# Patient Record
Sex: Female | Born: 1940 | Race: White | Hispanic: No | Marital: Married | Smoking: Never smoker
Health system: Southern US, Community
[De-identification: ages and names within clinical notes are randomized; demographics above are authoritative.]

## PROBLEM LIST (undated history)

## (undated) DIAGNOSIS — T7840XA Allergy, unspecified, initial encounter: Secondary | ICD-10-CM

## (undated) DIAGNOSIS — M1990 Unspecified osteoarthritis, unspecified site: Secondary | ICD-10-CM

## (undated) DIAGNOSIS — I2510 Atherosclerotic heart disease of native coronary artery without angina pectoris: Secondary | ICD-10-CM

## (undated) DIAGNOSIS — E7800 Pure hypercholesterolemia, unspecified: Secondary | ICD-10-CM

## (undated) DIAGNOSIS — I10 Essential (primary) hypertension: Secondary | ICD-10-CM

## (undated) HISTORY — DX: Essential (primary) hypertension: I10

## (undated) HISTORY — PX: TUBAL LIGATION: SHX77

## (undated) HISTORY — DX: Allergy, unspecified, initial encounter: T78.40XA

## (undated) HISTORY — PX: OTHER SURGICAL HISTORY: SHX169

## (undated) HISTORY — PX: TONSILLECTOMY: SUR1361

## (undated) HISTORY — PX: COLONOSCOPY: SHX174

---

## 1997-12-08 ENCOUNTER — Other Ambulatory Visit: Admission: RE | Admit: 1997-12-08 | Discharge: 1997-12-08 | Payer: Self-pay | Admitting: *Deleted

## 1998-05-13 ENCOUNTER — Other Ambulatory Visit: Admission: RE | Admit: 1998-05-13 | Discharge: 1998-05-13 | Payer: Self-pay | Admitting: *Deleted

## 1999-01-07 ENCOUNTER — Other Ambulatory Visit: Admission: RE | Admit: 1999-01-07 | Discharge: 1999-01-07 | Payer: Self-pay | Admitting: Obstetrics and Gynecology

## 1999-01-12 ENCOUNTER — Encounter: Payer: Self-pay | Admitting: Obstetrics and Gynecology

## 1999-01-12 ENCOUNTER — Encounter: Admission: RE | Admit: 1999-01-12 | Discharge: 1999-01-12 | Payer: Self-pay | Admitting: Obstetrics and Gynecology

## 1999-02-17 ENCOUNTER — Other Ambulatory Visit: Admission: RE | Admit: 1999-02-17 | Discharge: 1999-02-17 | Payer: Self-pay | Admitting: Obstetrics and Gynecology

## 1999-02-18 ENCOUNTER — Other Ambulatory Visit: Admission: RE | Admit: 1999-02-18 | Discharge: 1999-02-18 | Payer: Self-pay | Admitting: Obstetrics and Gynecology

## 2000-04-18 ENCOUNTER — Other Ambulatory Visit: Admission: RE | Admit: 2000-04-18 | Discharge: 2000-04-18 | Payer: Self-pay | Admitting: *Deleted

## 2000-04-20 ENCOUNTER — Encounter: Admission: RE | Admit: 2000-04-20 | Discharge: 2000-04-20 | Payer: Self-pay | Admitting: Obstetrics and Gynecology

## 2000-04-20 ENCOUNTER — Encounter: Payer: Self-pay | Admitting: *Deleted

## 2002-12-23 ENCOUNTER — Encounter: Payer: Self-pay | Admitting: Internal Medicine

## 2002-12-23 ENCOUNTER — Encounter: Admission: RE | Admit: 2002-12-23 | Discharge: 2002-12-23 | Payer: Self-pay | Admitting: Internal Medicine

## 2004-10-08 ENCOUNTER — Ambulatory Visit: Payer: Self-pay | Admitting: Physical Medicine & Rehabilitation

## 2004-10-08 ENCOUNTER — Inpatient Hospital Stay (HOSPITAL_COMMUNITY): Admission: AC | Admit: 2004-10-08 | Discharge: 2004-10-13 | Payer: Self-pay

## 2004-10-13 ENCOUNTER — Inpatient Hospital Stay (HOSPITAL_COMMUNITY)
Admission: RE | Admit: 2004-10-13 | Discharge: 2004-10-27 | Payer: Self-pay | Admitting: Physical Medicine & Rehabilitation

## 2004-10-13 ENCOUNTER — Ambulatory Visit: Payer: Self-pay | Admitting: Physical Medicine & Rehabilitation

## 2004-10-31 ENCOUNTER — Encounter
Admission: RE | Admit: 2004-10-31 | Discharge: 2004-11-29 | Payer: Self-pay | Admitting: Physical Medicine & Rehabilitation

## 2004-11-10 ENCOUNTER — Ambulatory Visit: Payer: Self-pay | Admitting: Psychology

## 2004-11-25 ENCOUNTER — Ambulatory Visit: Payer: Self-pay | Admitting: Physical Medicine & Rehabilitation

## 2004-11-25 ENCOUNTER — Encounter
Admission: RE | Admit: 2004-11-25 | Discharge: 2005-02-23 | Payer: Self-pay | Admitting: Physical Medicine & Rehabilitation

## 2004-11-30 ENCOUNTER — Encounter
Admission: RE | Admit: 2004-11-30 | Discharge: 2004-12-28 | Payer: Self-pay | Admitting: Physical Medicine & Rehabilitation

## 2004-12-29 ENCOUNTER — Encounter
Admission: RE | Admit: 2004-12-29 | Discharge: 2005-01-23 | Payer: Self-pay | Admitting: Physical Medicine & Rehabilitation

## 2005-01-24 ENCOUNTER — Encounter
Admission: RE | Admit: 2005-01-24 | Discharge: 2005-02-21 | Payer: Self-pay | Admitting: Physical Medicine & Rehabilitation

## 2005-02-17 ENCOUNTER — Ambulatory Visit: Payer: Self-pay | Admitting: Physical Medicine & Rehabilitation

## 2005-02-22 ENCOUNTER — Encounter
Admission: RE | Admit: 2005-02-22 | Discharge: 2005-03-28 | Payer: Self-pay | Admitting: Physical Medicine & Rehabilitation

## 2005-07-17 ENCOUNTER — Ambulatory Visit: Payer: Self-pay | Admitting: Physical Medicine & Rehabilitation

## 2005-07-17 ENCOUNTER — Encounter
Admission: RE | Admit: 2005-07-17 | Discharge: 2005-10-15 | Payer: Self-pay | Admitting: Physical Medicine & Rehabilitation

## 2012-07-30 ENCOUNTER — Ambulatory Visit (AMBULATORY_SURGERY_CENTER): Payer: Medicare Other

## 2012-07-30 VITALS — Ht 65.0 in | Wt 170.8 lb

## 2012-07-30 DIAGNOSIS — Z1211 Encounter for screening for malignant neoplasm of colon: Secondary | ICD-10-CM

## 2012-07-30 DIAGNOSIS — Z800 Family history of malignant neoplasm of digestive organs: Secondary | ICD-10-CM

## 2012-07-30 MED ORDER — MOVIPREP 100 G PO SOLR: ORAL | Status: DC

## 2012-07-30 NOTE — Progress Notes (Signed)
Pt came into the office today for her pre-visit prior to her colonoscopy with Dr  Marina Goodell on 08/09/12. Pt states she had a colonoscopy done 10 years ago with Dr Kinnie Scales. A medical release form was filled out to be given to [place] ([facility]MA).

## 2012-07-31 ENCOUNTER — Encounter: Payer: Self-pay | Admitting: Internal Medicine

## 2012-08-09 ENCOUNTER — Ambulatory Visit (AMBULATORY_SURGERY_CENTER): Payer: Medicare Other | Admitting: Internal Medicine

## 2012-08-09 ENCOUNTER — Encounter: Payer: Self-pay | Admitting: Internal Medicine

## 2012-08-09 VITALS — BP 147/89 | HR 63 | Temp 99.6°F | Resp 33 | Ht 65.0 in | Wt 170.0 lb

## 2012-08-09 DIAGNOSIS — Z800 Family history of malignant neoplasm of digestive organs: Secondary | ICD-10-CM

## 2012-08-09 DIAGNOSIS — D126 Benign neoplasm of colon, unspecified: Secondary | ICD-10-CM

## 2012-08-09 DIAGNOSIS — Z1211 Encounter for screening for malignant neoplasm of colon: Secondary | ICD-10-CM

## 2012-08-09 MED ORDER — SODIUM CHLORIDE 0.9 % IV SOLN: 500.0000 mL | INTRAVENOUS | Status: DC

## 2012-08-09 NOTE — Progress Notes (Signed)
Called to room to assist during endoscopic procedure.  Patient ID and intended procedure confirmed with present staff. Received instructions for my participation in the procedure from the performing physician.  

## 2012-08-09 NOTE — Progress Notes (Signed)
Report to pacu rn, vss, bbs=clear 

## 2012-08-09 NOTE — Op Note (Signed)
[facility] [address] [place], [address]   COLONOSCOPY PROCEDURE REPORT  PATIENT: Thompson, Debra  MR#: [id number] BIRTHDATE: 08-18-1940 , 72  yrs. old GENDER: Female ENDOSCOPIST: Roxy Cedar, MD REFERRED UU:VOZDGUY Tisovec, M.D. PROCEDURE DATE:  08/09/2012 PROCEDURE:   Colonoscopy with snare polypectomy    x 1 ASA CLASS:   Class II INDICATIONS:Average risk patient for colon cancer.   Reports negative screening exam with Dr. Kinnie Scales 10 yrs ago MEDICATIONS: MAC sedation, administered by CRNA and propofol (Diprivan) 250mg  IV  DESCRIPTION OF PROCEDURE:   After the risks benefits and alternatives of the procedure were thoroughly explained, informed consent was obtained.  A digital rectal exam revealed no abnormalities of the rectum.   The LB [id number]  endoscope was introduced through the anus and advanced to the cecum, which was identified by both the appendix and ileocecal valve. No adverse events experienced.   The quality of the prep was good, using MoviPrep  The instrument was then slowly withdrawn as the colon was fully examined.      COLON FINDINGS: A diminutive polyp was found in the transverse colon.  A polypectomy was performed with a cold snare.  The resection was complete and the polyp tissue was completely retrieved.   Moderate diverticulosis was noted The finding was in the left colon.   Moderate melanosis was found throughout the entire examined colon.   The colon mucosa was otherwise normal. Retroflexed views revealed internal hemorrhoids. The time to cecum=4 minutes 41 seconds.  Withdrawal time=11 minutes 27 seconds. The scope was withdrawn and the procedure completed. COMPLICATIONS: There were no complications.  ENDOSCOPIC IMPRESSION: 1.   Diminutive polyp was found in the transverse colon; polypectomy was performed with a cold snare 2.   Moderate diverticulosis was noted in the left colon 3.   The colon mucosa was  otherwise normal  RECOMMENDATIONS: 1. Return to the care of your primary provider.  GI follow up as needed   eSigned:  Roxy Cedar, MD 08/09/2012 12:35 PM  cc: Guerry Bruin, MD and The Patient   PATIENT NAME:  Debra Thompson, Debra Thompson MR#: [id number]

## 2012-08-09 NOTE — Progress Notes (Signed)
No egg or soy allergy. emw 

## 2012-08-09 NOTE — Progress Notes (Signed)
Patient did not have preoperative order for IV antibiotic SSI prophylaxis. (G8918)  Patient did not experience any of the following events: a burn prior to discharge; a fall within the facility; wrong site/side/patient/procedure/implant event; or a hospital transfer or hospital admission upon discharge from the facility. (G8907)  

## 2012-08-09 NOTE — Patient Instructions (Addendum)
YOU HAD AN ENDOSCOPIC PROCEDURE TODAY AT THE [facility]: Refer to the procedure report that was given to you for any specific questions about what was found during the examination.  If the procedure report does not answer your questions, please call your gastroenterologist to clarify.  If you requested that your care partner not be given the details of your procedure findings, then the procedure report has been included in a sealed envelope for you to review at your convenience later.  YOU SHOULD EXPECT: Some feelings of bloating in the abdomen. Passage of more gas than usual.  Walking can help get rid of the air that was put into your GI tract during the procedure and reduce the bloating. If you had a lower endoscopy (such as a colonoscopy or flexible sigmoidoscopy) you may notice spotting of blood in your stool or on the toilet paper. If you underwent a bowel prep for your procedure, then you may not have a normal bowel movement for a few days.  DIET: Your first meal following the procedure should be a light meal and then it is ok to progress to your normal diet.  A half-sandwich or bowl of soup is an example of a good first meal.  Heavy or fried foods are harder to digest and may make you feel nauseous or bloated.  Likewise meals heavy in dairy and vegetables can cause extra gas to form and this can also increase the bloating.  Drink plenty of fluids but you should avoid alcoholic beverages for 24 hours.  ACTIVITY: Your care partner should take you home directly after the procedure.  You should plan to take it easy, moving slowly for the rest of the day.  You can resume normal activity the day after the procedure however you should NOT DRIVE or use heavy machinery for 24 hours (because of the sedation medicines used during the test).    SYMPTOMS TO REPORT IMMEDIATELY: A gastroenterologist can be reached at any hour.  During normal business hours, 8:30 AM to 5:00 PM Monday through Friday,  call [phone].  After hours and on weekends, please call the GI answering service at [phone] who will take a message and have the physician on call contact you.   Following lower endoscopy (colonoscopy or flexible sigmoidoscopy):  Excessive amounts of blood in the stool  Significant tenderness or worsening of abdominal pains  Swelling of the abdomen that is new, acute  Fever of 100F or higher  Following upper endoscopy (EGD)  Vomiting of blood or coffee ground material  New chest pain or pain under the shoulder blades  Painful or persistently difficult swallowing  New shortness of breath  Fever of 100F or higher  Black, tarry-looking stools  FOLLOW UP: If any biopsies were taken you will be contacted by phone or by letter within the next 1-3 weeks.  Call your gastroenterologist if you have not heard about the biopsies in 3 weeks.  Our staff will call the home number listed on your records the next business day following your procedure to check on you and address any questions or concerns that you may have at that time regarding the information given to you following your procedure. This is a courtesy call and so if there is no answer at the home number and we have not heard from you through the emergency physician on call, we will assume that you have returned to your regular daily activities without incident.  SIGNATURES/CONFIDENTIALITY: You and/or your care   partner have signed paperwork which will be entered into your electronic medical record.  These signatures attest to the fact that that the information above on your After Visit Summary has been reviewed and is understood.  Full responsibility of the confidentiality of this discharge information lies with you and/or your care-partner.    INFORMATION ON POLYPS,DIVERTICULOSIS,& HIGH FIBER DIET GIVEN TO YOU TODAY  

## 2012-08-12 ENCOUNTER — Telehealth: Payer: Self-pay | Admitting: *Deleted

## 2012-08-12 NOTE — Telephone Encounter (Signed)
Left message on #[phone] that pts husband had requested we call for f/u . No answer received @ home #.

## 2012-08-13 ENCOUNTER — Encounter: Payer: Self-pay | Admitting: Internal Medicine

## 2014-01-02 ENCOUNTER — Other Ambulatory Visit: Payer: Self-pay | Admitting: Internal Medicine

## 2014-01-02 DIAGNOSIS — Z1231 Encounter for screening mammogram for malignant neoplasm of breast: Secondary | ICD-10-CM

## 2014-01-23 ENCOUNTER — Ambulatory Visit: Payer: Medicare Other

## 2014-04-30 ENCOUNTER — Encounter (HOSPITAL_COMMUNITY): Payer: Self-pay | Admitting: *Deleted

## 2014-04-30 ENCOUNTER — Inpatient Hospital Stay (HOSPITAL_COMMUNITY)
Admission: EM | Admit: 2014-04-30 | Discharge: 2014-05-03 | DRG: 282 | Disposition: A | Discharge status: Home / Self Care | Payer: Medicare Other | Attending: Cardiology | Admitting: Cardiology

## 2014-04-30 ENCOUNTER — Emergency Department (HOSPITAL_COMMUNITY): Payer: Medicare Other

## 2014-04-30 DIAGNOSIS — I10 Essential (primary) hypertension: Secondary | ICD-10-CM | POA: Diagnosis present

## 2014-04-30 DIAGNOSIS — E7800 Pure hypercholesterolemia, unspecified: Secondary | ICD-10-CM | POA: Diagnosis present

## 2014-04-30 DIAGNOSIS — M1990 Unspecified osteoarthritis, unspecified site: Secondary | ICD-10-CM | POA: Diagnosis present

## 2014-04-30 DIAGNOSIS — R079 Chest pain, unspecified: Secondary | ICD-10-CM

## 2014-04-30 DIAGNOSIS — Z8249 Family history of ischemic heart disease and other diseases of the circulatory system: Secondary | ICD-10-CM | POA: Diagnosis not present

## 2014-04-30 DIAGNOSIS — Z79899 Other long term (current) drug therapy: Secondary | ICD-10-CM | POA: Diagnosis not present

## 2014-04-30 DIAGNOSIS — I25119 Atherosclerotic heart disease of native coronary artery with unspecified angina pectoris: Secondary | ICD-10-CM | POA: Diagnosis present

## 2014-04-30 DIAGNOSIS — R7989 Other specified abnormal findings of blood chemistry: Secondary | ICD-10-CM

## 2014-04-30 DIAGNOSIS — I119 Hypertensive heart disease without heart failure: Secondary | ICD-10-CM | POA: Diagnosis present

## 2014-04-30 DIAGNOSIS — I214 Non-ST elevation (NSTEMI) myocardial infarction: Secondary | ICD-10-CM | POA: Diagnosis present

## 2014-04-30 DIAGNOSIS — Z823 Family history of stroke: Secondary | ICD-10-CM

## 2014-04-30 DIAGNOSIS — Z91041 Radiographic dye allergy status: Secondary | ICD-10-CM

## 2014-04-30 DIAGNOSIS — Z87891 Personal history of nicotine dependence: Secondary | ICD-10-CM | POA: Diagnosis not present

## 2014-04-30 DIAGNOSIS — E785 Hyperlipidemia, unspecified: Secondary | ICD-10-CM | POA: Diagnosis present

## 2014-04-30 HISTORY — DX: Atherosclerotic heart disease of native coronary artery without angina pectoris: I25.10

## 2014-04-30 HISTORY — DX: Pure hypercholesterolemia, unspecified: E78.00

## 2014-04-30 HISTORY — DX: Unspecified osteoarthritis, unspecified site: M19.90

## 2014-04-30 LAB — BASIC METABOLIC PANEL
ANION GAP: 7 (ref 5–15)
BUN: 11 mg/dL (ref 6–23)
CALCIUM: 8.9 mg/dL (ref 8.4–10.5)
CO2: 25 mmol/L (ref 19–32)
CREATININE: 0.58 mg/dL (ref 0.50–1.10)
Chloride: 109 mmol/L (ref 96–112)
GFR calc non Af Amer: 89 mL/min — ABNORMAL LOW (ref >90)
Glucose, Bld: 131 mg/dL — ABNORMAL HIGH (ref 70–99)
Potassium: 4.1 mmol/L (ref 3.5–5.1)
Sodium: 141 mmol/L (ref 135–145)

## 2014-04-30 LAB — CBC
HEMATOCRIT: 38.8 % (ref 36.0–46.0)
HEMOGLOBIN: 13.1 g/dL (ref 12.0–15.0)
MCH: 30.5 pg (ref 26.0–34.0)
MCHC: 33.8 g/dL (ref 30.0–36.0)
MCV: 90.4 fL (ref 78.0–100.0)
Platelets: 198 10*3/uL (ref 150–400)
RBC: 4.29 MIL/uL (ref 3.87–5.11)
RDW: 13 % (ref 11.5–15.5)
WBC: 8 10*3/uL (ref 4.0–10.5)

## 2014-04-30 LAB — I-STAT TROPONIN, ED: Troponin i, poc: 0.98 ng/mL (ref 0.00–0.08)

## 2014-04-30 MED ORDER — HEPARIN BOLUS VIA INFUSION
4000.0000 [IU] | Freq: Once | INTRAVENOUS | Status: AC
Start: 2014-04-30 — End: 2014-04-30
  Administered 2014-04-30: 4000 [IU] via INTRAVENOUS
  Filled 2014-04-30: qty 4000

## 2014-04-30 MED ORDER — MORPHINE SULFATE 2 MG/ML IJ SOLN
2.0000 mg | Freq: Once | INTRAMUSCULAR | Status: AC
  Administered 2014-04-30: 2 mg via INTRAVENOUS
  Filled 2014-04-30: qty 1

## 2014-04-30 MED ORDER — ACETAMINOPHEN 325 MG PO TABS
650.0000 mg | ORAL_TABLET | ORAL | Status: DC | PRN
Start: 2014-04-30 — End: 2014-05-03
  Administered 2014-05-01: 650 mg via ORAL
  Filled 2014-04-30: qty 2

## 2014-04-30 MED ORDER — NITROGLYCERIN 0.4 MG SL SUBL
0.4000 mg | SUBLINGUAL_TABLET | SUBLINGUAL | Status: DC | PRN
  Administered 2014-05-01 (×2): 0.4 mg via SUBLINGUAL
  Filled 2014-04-30 (×2): qty 1

## 2014-04-30 MED ORDER — SODIUM CHLORIDE 0.9 % IJ SOLN: 3.0000 mL | Freq: Two times a day (BID) | INTRAMUSCULAR | Status: DC

## 2014-04-30 MED ORDER — HEPARIN (PORCINE) IN NACL 100-0.45 UNIT/ML-% IJ SOLN
850.0000 [IU]/h | INTRAMUSCULAR | Status: DC
  Administered 2014-04-30: 850 [IU]/h via INTRAVENOUS
  Filled 2014-04-30: qty 250

## 2014-04-30 MED ORDER — ONDANSETRON HCL 4 MG/2ML IJ SOLN: 4.0000 mg | Freq: Four times a day (QID) | INTRAMUSCULAR | Status: DC | PRN

## 2014-04-30 MED ORDER — DIPHENHYDRAMINE HCL 50 MG/ML IJ SOLN
25.0000 mg | INTRAMUSCULAR | Status: AC
  Administered 2014-05-01: 25 mg via INTRAVENOUS
  Filled 2014-04-30: qty 1

## 2014-04-30 MED ORDER — HEPARIN (PORCINE) IN NACL 100-0.45 UNIT/ML-% IJ SOLN: 12.0000 [IU]/kg/h | INTRAMUSCULAR | Status: DC

## 2014-04-30 MED ORDER — ATORVASTATIN CALCIUM 10 MG PO TABS
10.0000 mg | ORAL_TABLET | Freq: Every day | ORAL | Status: DC
  Administered 2014-05-01 – 2014-05-03 (×3): 10 mg via ORAL
  Filled 2014-04-30 (×3): qty 1

## 2014-04-30 MED ORDER — PREDNISONE 20 MG PO TABS
60.0000 mg | ORAL_TABLET | ORAL | Status: AC
  Administered 2014-05-01: 60 mg via ORAL
  Filled 2014-04-30: qty 3

## 2014-04-30 MED ORDER — ASPIRIN 300 MG RE SUPP: 300.0000 mg | RECTAL | Status: AC

## 2014-04-30 MED ORDER — SODIUM CHLORIDE 0.9 % IV SOLN
INTRAVENOUS | Status: DC
  Administered 2014-05-01: 04:00:00 via INTRAVENOUS

## 2014-04-30 MED ORDER — ASPIRIN 81 MG PO CHEW: 324.0000 mg | CHEWABLE_TABLET | ORAL | Status: AC

## 2014-04-30 MED ORDER — SODIUM CHLORIDE 0.9 % IJ SOLN: 3.0000 mL | INTRAMUSCULAR | Status: DC | PRN

## 2014-04-30 MED ORDER — METOPROLOL TARTRATE 25 MG PO TABS
25.0000 mg | ORAL_TABLET | Freq: Two times a day (BID) | ORAL | Status: DC
  Administered 2014-05-01 – 2014-05-03 (×6): 25 mg via ORAL
  Filled 2014-04-30 (×6): qty 1

## 2014-04-30 MED ORDER — ASPIRIN 81 MG PO CHEW: 81.0000 mg | CHEWABLE_TABLET | ORAL | Status: AC

## 2014-04-30 MED ORDER — ASPIRIN EC 81 MG PO TBEC
81.0000 mg | DELAYED_RELEASE_TABLET | Freq: Every day | ORAL | Status: DC
  Administered 2014-05-01 – 2014-05-03 (×3): 81 mg via ORAL
  Filled 2014-04-30 (×3): qty 1

## 2014-04-30 MED ORDER — FAMOTIDINE IN NACL 20-0.9 MG/50ML-% IV SOLN
20.0000 mg | INTRAVENOUS | Status: AC
  Administered 2014-05-01: 20 mg via INTRAVENOUS
  Filled 2014-04-30: qty 50

## 2014-04-30 MED ORDER — RAMIPRIL 5 MG PO CAPS
5.0000 mg | ORAL_CAPSULE | Freq: Every day | ORAL | Status: DC
  Administered 2014-05-01 – 2014-05-03 (×3): 5 mg via ORAL
  Filled 2014-04-30 (×3): qty 1

## 2014-04-30 MED ORDER — SODIUM CHLORIDE 0.9 % IV SOLN: 250.0000 mL | INTRAVENOUS | Status: DC | PRN

## 2014-04-30 NOTE — Progress Notes (Signed)
ANTICOAGULATION CONSULT NOTE - Initial Consult  Pharmacy Consult for Heparin Indication: chest pain/ACS  Allergies  Allergen Reactions  . Iodine Rash    rash    Patient Measurements: Height: 5\' 5"  (165.1 cm) Weight: 176 lb (79.833 kg) IBW/kg (Calculated) : 57 Heparin Dosing Weight: 74 kg  Vital Signs: Temp: 98.4 F (36.9 C) (02/25 2003) Temp Source: Oral (02/25 2003) BP: 178/69 mmHg (02/25 2003) Pulse Rate: 68 (02/25 2003)  Labs:  Recent Labs  04/30/14 2015  HGB 13.1  HCT 38.8  PLT 198    CrCl cannot be calculated (Patient has no serum creatinine result on file.).   Medical History: Past Medical History  Diagnosis Date  . Hypertension   . Allergy   . Hypercholesteremia   . Arthritis     Medications:   (Not in a hospital admission) Scheduled:   Infusions:  . heparin    . heparin      Assessment: 74yo female with history of HLD and HTN presents with chest pain. Pharmacy is consulted to dose heparin for ACS/chest pain. CBC wnl and Trop 0.98.  Goal of Therapy:  Heparin level 0.3-0.7 units/ml Monitor platelets by anticoagulation protocol: Yes   Plan:  Give 4000 units bolus x 1 Start heparin infusion at 850 units/hr Check anti-Xa level in 8 hours and daily while on heparin Continue to monitor H&H and platelets  Monitor s/sx of bleeding  Andrey Cota. Diona Foley, PharmD Clinical Pharmacist Pager [phone] 04/30/2014,8:55 PM

## 2014-04-30 NOTE — Progress Notes (Signed)
i-stat Trop. result shown to Dr. Sabra Heck
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
## 2014-04-30 NOTE — ED Notes (Signed)
Pt arrives via EMS from home. Pt was working in the yard and lifting heavy objects when she began having chest pain at 1730. Pt then went to Urgent Care and received 324 ASA and 1 NTG. Upon EMS arrival pt was experiencing 8/10 chest pain and EMS gave another dose of NTG which then took her pain away. Pt has hx HTN and has not taken her BP meds in a few days.

## 2014-05-01 ENCOUNTER — Encounter (HOSPITAL_COMMUNITY): Payer: Self-pay | Admitting: Interventional Cardiology

## 2014-05-01 ENCOUNTER — Encounter (HOSPITAL_COMMUNITY)
Admission: EM | Disposition: A | Discharge status: Home / Self Care | Payer: Self-pay | Source: Home / Self Care | Attending: Cardiology

## 2014-05-01 DIAGNOSIS — E7800 Pure hypercholesterolemia, unspecified: Secondary | ICD-10-CM | POA: Diagnosis present

## 2014-05-01 DIAGNOSIS — I213 ST elevation (STEMI) myocardial infarction of unspecified site: Secondary | ICD-10-CM

## 2014-05-01 DIAGNOSIS — I214 Non-ST elevation (NSTEMI) myocardial infarction: Principal | ICD-10-CM

## 2014-05-01 DIAGNOSIS — I10 Essential (primary) hypertension: Secondary | ICD-10-CM | POA: Diagnosis present

## 2014-05-01 DIAGNOSIS — I2510 Atherosclerotic heart disease of native coronary artery without angina pectoris: Secondary | ICD-10-CM

## 2014-05-01 DIAGNOSIS — I25110 Atherosclerotic heart disease of native coronary artery with unstable angina pectoris: Secondary | ICD-10-CM

## 2014-05-01 HISTORY — DX: Atherosclerotic heart disease of native coronary artery without angina pectoris: I25.10

## 2014-05-01 HISTORY — PX: LEFT HEART CATHETERIZATION WITH CORONARY ANGIOGRAM: SHX5451

## 2014-05-01 LAB — COMPREHENSIVE METABOLIC PANEL
ALK PHOS: 53 U/L (ref 39–117)
ALT: 26 U/L (ref 0–35)
AST: 38 U/L — ABNORMAL HIGH (ref 0–37)
Albumin: 3.6 g/dL (ref 3.5–5.2)
Anion gap: 7 (ref 5–15)
BILIRUBIN TOTAL: 0.5 mg/dL (ref 0.3–1.2)
BUN: 8 mg/dL (ref 6–23)
CHLORIDE: 106 mmol/L (ref 96–112)
CO2: 27 mmol/L (ref 19–32)
Calcium: 8.6 mg/dL (ref 8.4–10.5)
Creatinine, Ser: 0.58 mg/dL (ref 0.50–1.10)
GFR, EST NON AFRICAN AMERICAN: 89 mL/min — AB (ref >90)
GLUCOSE: 127 mg/dL — AB (ref 70–99)
POTASSIUM: 3.7 mmol/L (ref 3.5–5.1)
Sodium: 140 mmol/L (ref 135–145)
Total Protein: 6.7 g/dL (ref 6.0–8.3)

## 2014-05-01 LAB — CBC WITH DIFFERENTIAL/PLATELET
BASOS PCT: 0 % (ref 0–1)
Basophils Absolute: 0 10*3/uL (ref 0.0–0.1)
Eosinophils Absolute: 0.1 10*3/uL (ref 0.0–0.7)
Eosinophils Relative: 1 % (ref 0–5)
HCT: 37.6 % (ref 36.0–46.0)
HEMOGLOBIN: 12.8 g/dL (ref 12.0–15.0)
LYMPHS ABS: 2.4 10*3/uL (ref 0.7–4.0)
Lymphocytes Relative: 28 % (ref 12–46)
MCH: 30.1 pg (ref 26.0–34.0)
MCHC: 34 g/dL (ref 30.0–36.0)
MCV: 88.5 fL (ref 78.0–100.0)
Monocytes Absolute: 0.5 10*3/uL (ref 0.1–1.0)
Monocytes Relative: 6 % (ref 3–12)
NEUTROS ABS: 5.6 10*3/uL (ref 1.7–7.7)
NEUTROS PCT: 65 % (ref 43–77)
PLATELETS: 224 10*3/uL (ref 150–400)
RBC: 4.25 MIL/uL (ref 3.87–5.11)
RDW: 13.1 % (ref 11.5–15.5)
WBC: 8.6 10*3/uL (ref 4.0–10.5)

## 2014-05-01 LAB — TROPONIN I
TROPONIN I: 2.57 ng/mL — AB (ref <0.031)
Troponin I: 4.1 ng/mL (ref <0.031)
Troponin I: 3.16 ng/mL (ref <0.031)

## 2014-05-01 LAB — LIPID PANEL
Cholesterol: 145 mg/dL (ref 0–200)
HDL: 58 mg/dL (ref >39)
LDL Cholesterol: 62 mg/dL (ref 0–99)
Total CHOL/HDL Ratio: 2.5 RATIO
Triglycerides: 124 mg/dL (ref <150)
VLDL: 25 mg/dL (ref 0–40)

## 2014-05-01 LAB — HEPARIN LEVEL (UNFRACTIONATED)
Heparin Unfractionated: 0.42 IU/mL (ref 0.30–0.70)
Heparin Unfractionated: 0.53 IU/mL (ref 0.30–0.70)

## 2014-05-01 LAB — PLATELET INHIBITION P2Y12: Platelet Function  P2Y12: 273 [PRU] (ref 194–418)

## 2014-05-01 LAB — PROTIME-INR
INR: 1.02 (ref 0.00–1.49)
Prothrombin Time: 13.5 seconds (ref 11.6–15.2)

## 2014-05-01 LAB — TSH: TSH: 1.723 u[IU]/mL (ref 0.350–4.500)

## 2014-05-01 LAB — MAGNESIUM: Magnesium: 2.3 mg/dL (ref 1.5–2.5)

## 2014-05-01 LAB — MRSA PCR SCREENING: MRSA by PCR: NEGATIVE

## 2014-05-01 LAB — APTT: aPTT: 70 seconds — ABNORMAL HIGH (ref 24–37)

## 2014-05-01 SURGERY — LEFT HEART CATHETERIZATION WITH CORONARY ANGIOGRAM
Anesthesia: LOCAL

## 2014-05-01 MED ORDER — SODIUM CHLORIDE 0.9 % IV SOLN: INTRAVENOUS | Status: AC

## 2014-05-01 MED ORDER — NITROGLYCERIN 1 MG/10 ML FOR IR/CATH LAB
INTRA_ARTERIAL | Status: AC
  Filled 2014-05-01: qty 10

## 2014-05-01 MED ORDER — MIDAZOLAM HCL 2 MG/2ML IJ SOLN
INTRAMUSCULAR | Status: AC
  Filled 2014-05-01: qty 2

## 2014-05-01 MED ORDER — HEPARIN (PORCINE) IN NACL 2-0.9 UNIT/ML-% IJ SOLN
INTRAMUSCULAR | Status: AC
  Filled 2014-05-01: qty 1000

## 2014-05-01 MED ORDER — HEPARIN SODIUM (PORCINE) 1000 UNIT/ML IJ SOLN
2000.0000 [IU] | Freq: Once | INTRAMUSCULAR | Status: DC
  Filled 2014-05-01: qty 2

## 2014-05-01 MED ORDER — HEPARIN SODIUM (PORCINE) 1000 UNIT/ML IJ SOLN
INTRAMUSCULAR | Status: AC
  Filled 2014-05-01: qty 1

## 2014-05-01 MED ORDER — CLOPIDOGREL BISULFATE 75 MG PO TABS
75.0000 mg | ORAL_TABLET | Freq: Every day | ORAL | Status: DC
  Administered 2014-05-02 – 2014-05-03 (×2): 75 mg via ORAL
  Filled 2014-05-01 (×2): qty 1

## 2014-05-01 MED ORDER — FENTANYL CITRATE 0.05 MG/ML IJ SOLN
INTRAMUSCULAR | Status: AC
  Filled 2014-05-01: qty 2

## 2014-05-01 MED ORDER — LIDOCAINE HCL (PF) 1 % IJ SOLN
INTRAMUSCULAR | Status: AC
  Filled 2014-05-01: qty 30

## 2014-05-01 MED ORDER — HYDROCODONE-ACETAMINOPHEN 5-325 MG PO TABS
1.0000 | ORAL_TABLET | Freq: Once | ORAL | Status: AC
  Administered 2014-05-01: 1 via ORAL
  Filled 2014-05-01: qty 1

## 2014-05-01 MED ORDER — NITROGLYCERIN IN D5W 200-5 MCG/ML-% IV SOLN: 0.0000 ug/min | INTRAVENOUS | Status: DC

## 2014-05-01 MED ORDER — VERAPAMIL HCL 2.5 MG/ML IV SOLN
INTRAVENOUS | Status: AC
  Filled 2014-05-01: qty 2

## 2014-05-01 MED ORDER — HEPARIN (PORCINE) IN NACL 100-0.45 UNIT/ML-% IJ SOLN
850.0000 [IU]/h | INTRAMUSCULAR | Status: DC
  Administered 2014-05-02: 850 [IU]/h via INTRAVENOUS
  Filled 2014-05-01: qty 250

## 2014-05-01 NOTE — Progress Notes (Signed)
ANTICOAGULATION CONSULT NOTE - Follow Up Consult  Pharmacy Consult for Heparin Indication: chest pain/ACS  Allergies  Allergen Reactions  . Iodine Rash    rash    Patient Measurements: Height: 5\' 5"  (165.1 cm) Weight: 170 lb 4.8 oz (77.248 kg) IBW/kg (Calculated) : 57 Heparin Dosing Weight: 73 kg  Vital Signs: Temp: 98.4 F (36.9 C) (02/26 1537) Temp Source: Oral (02/26 1537) BP: 141/61 mmHg (02/26 1922) Pulse Rate: 63 (02/26 1920)  Labs:  Recent Labs  04/30/14 2015 05/01/14 0103 05/01/14 0540 05/01/14 0753 05/01/14 1203  HGB 13.1 12.8  --   --   --   HCT 38.8 37.6  --   --   --   PLT 198 224  --   --   --   APTT  --  70*  --   --   --   LABPROT  --  13.5  --   --   --   INR  --  1.02  --   --   --   HEPARINUNFRC  --  0.42  --  0.53  --   CREATININE 0.58 0.58  --   --   --   TROPONINI  --  4.10* 2.57*  --  3.16*    Estimated Creatinine Clearance: 63.4 mL/min (by C-G formula based on Cr of 0.58).   Medications:  Scheduled:  . aspirin  324 mg Oral NOW   Or  . aspirin  300 mg Rectal NOW  . aspirin EC  81 mg Oral Daily  . atorvastatin  10 mg Oral Daily  . [START ON 05/02/2014] clopidogrel  75 mg Oral Q breakfast  . heparin  2,000 Units Intravenous Once  . metoprolol tartrate  25 mg Oral BID  . ramipril  5 mg Oral Daily   Infusions:  . sodium chloride 50 mL/hr at 05/01/14 0405  . sodium chloride    . heparin 850 Units/hr (04/30/14 2134)  . nitroGLYCERIN      Assessment: 74 yo F with NSTEMI. Pt got cath this PM. CAD. Recommending medrx for now. If symptoms recur, will need PCI. Heparin reordered 8hr post sheath removal.   Goal of Therapy:  Heparin level 0.3-0.7 units/ml Monitor platelets by anticoagulation protocol: Yes   Plan:   Resuming heparin at 850 units/hr Daily heparin level and CBC while on heparin

## 2014-05-01 NOTE — Progress Notes (Signed)
Echocardiogram 2D Echocardiogram has been performed.  Joelene Millin 05/01/2014, 10:56 AM

## 2014-05-01 NOTE — Progress Notes (Signed)
Pt C/O chest discomfort. 1 Nitro given. Pt states she feels relief. Will continue to monitor. Etta Quill, RN

## 2014-05-01 NOTE — H&P (View-Only) (Signed)
Patient Name: Debra Thompson Date of Encounter: 05/01/2014  Primary Cardiologist: New- Dr. Radford Pax   Principal Problem:   NSTEMI (non-ST elevated myocardial infarction) Active Problems:   Hypertension   Hypercholesteremia    SUBJECTIVE  Chest discomfort significantly improved. No SOB  CURRENT MEDS . aspirin  324 mg Oral NOW   Or  . aspirin  300 mg Rectal NOW  . aspirin EC  81 mg Oral Daily  . atorvastatin  10 mg Oral Daily  . diphenhydrAMINE  25 mg Intravenous Pre-Cath  . famotidine (PEPCID) IV  20 mg Intravenous Pre-Cath  . metoprolol tartrate  25 mg Oral BID  . predniSONE  60 mg Oral Pre-Cath  . ramipril  5 mg Oral Daily  . sodium chloride  3 mL Intravenous Q12H    OBJECTIVE  Filed Vitals:   04/30/14 2300 04/30/14 2345 05/01/14 0413 05/01/14 0801  BP: 187/84 175/81 156/79 144/59  Pulse: 68   67  Temp:   98.5 F (36.9 C) 98.2 F (36.8 C)  TempSrc:   Oral Oral  Resp: 17 17 18 15   Height:      Weight:    170 lb 4.8 oz (77.248 kg)  SpO2: 97%  97% 95%    Intake/Output Summary (Last 24 hours) at 05/01/14 0958 Last data filed at 05/01/14 0800  Gross per 24 hour  Intake   58.3 ml  Output   1550 ml  Net -1491.7 ml   Filed Weights   04/30/14 2003 05/01/14 0801  Weight: 176 lb (79.833 kg) 170 lb 4.8 oz (77.248 kg)    PHYSICAL EXAM  General: Pleasant, NAD. Neuro: Alert and oriented X 3. Moves all extremities spontaneously. Psych: Normal affect. HEENT:  Normal  Neck: Supple without bruits or JVD. Lungs:  Resp regular and unlabored, CTA. Heart: RRR no s3, s4, or murmurs. Abdomen: Soft, non-tender, non-distended, BS + x 4.  Extremities: No clubbing, cyanosis or edema. DP/PT/Radials 2+ and equal bilaterally.  Accessory Clinical Findings  CBC  Recent Labs  04/30/14 2015 05/01/14 0103  WBC 8.0 8.6  NEUTROABS  --  5.6  HGB 13.1 12.8  HCT 38.8 37.6  MCV 90.4 88.5  PLT 198 656   Basic Metabolic Panel  Recent Labs  04/30/14 2015 05/01/14 0103    NA 141 140  K 4.1 3.7  CL 109 106  CO2 25 27  GLUCOSE 131* 127*  BUN 11 8  CREATININE 0.58 0.58  CALCIUM 8.9 8.6  MG  --  2.3   Liver Function Tests  Recent Labs  05/01/14 0103  AST 38*  ALT 26  ALKPHOS 53  BILITOT 0.5  PROT 6.7  ALBUMIN 3.6   Cardiac Enzymes  Recent Labs  05/01/14 0103 05/01/14 0540  TROPONINI 4.10* 2.57*   Fasting Lipid Panel  Recent Labs  05/01/14 0103  CHOL 145  HDL 58  LDLCALC 62  TRIG 124  CHOLHDL 2.5   Thyroid Function Tests  Recent Labs  05/01/14 0103  TSH 1.723    TELE NSR with HR 60s, 6 beats run of NSVT this morning    ECG  NSR without significant ST changes, poor R wave progression in the anterior lead  Echocardiogram  pending    Radiology/Studies  Dg Chest Port 1 View  04/30/2014   CLINICAL DATA:  74 year old with chest pain for 1 day.  EXAM: PORTABLE CHEST - 1 VIEW  COMPARISON:  None.  FINDINGS: Heart and mediastinum are within normal limits. The lungs are  clear bilaterally. Bony thorax is intact. Negative for a pneumothorax. There is a round dense structure overlying the medial left clavicle and likely within bone.  IMPRESSION: No acute chest findings.   Electronically Signed   By: Markus Daft M.D.   On: 04/30/2014 20:27    ASSESSMENT AND PLAN  1. NSTEMI  - pending echo today.   - cath this afternoon. Benefit and risk of procedure discussed by Dr. Radford Pax yesterday. No obvious contraindication to DES  - pretreated with benadryl, pepcid and prednisone in anticipation of cath  2. HTN: likely need better control after cath if still high 3. Dyslipidemia: on lipitor 4. Iodine allergy with rash  Ruben Im Pager: [phone]  The patient is for cardiac catheterization this afternoon.  Daryel November, MD

## 2014-05-01 NOTE — Interval H&P Note (Signed)
Cath Lab Visit (complete for each Cath Lab visit)  Clinical Evaluation Leading to the Procedure:   ACS: Yes.    Non-ACS:    Anginal Classification: CCS III  Anti-ischemic medical therapy: Minimal Therapy (1 class of medications)  Non-Invasive Test Results: No non-invasive testing performed  Prior CABG: No previous CABG      History and Physical Interval Note:  05/01/2014 5:53 PM  Debra Thompson  has presented today for surgery, with the diagnosis of NSTEMI  The various methods of treatment have been discussed with the patient and family. After consideration of risks, benefits and other options for treatment, the patient has consented to  Procedure(s): LEFT HEART CATHETERIZATION WITH CORONARY ANGIOGRAM (N/A) as a surgical intervention .  The patient's history has been reviewed, patient examined, no change in status, stable for surgery.  I have reviewed the patient's chart and labs.  Questions were answered to the patient's satisfaction.     Sinclair Grooms

## 2014-05-01 NOTE — Progress Notes (Signed)
ANTICOAGULATION CONSULT NOTE - Follow Up Consult  Pharmacy Consult for heparin Indication: chest pain/ACS  Labs:  Recent Labs  04/30/14 2015 05/01/14 0103  HGB 13.1 12.8  HCT 38.8 37.6  PLT 198 224  APTT  --  70*  LABPROT  --  13.5  INR  --  1.02  HEPARINUNFRC  --  0.42  CREATININE 0.58 0.58  TROPONINI  --  4.10*    Assessment/Plan:  74yo female therapeutic on heparin with initial dosing for CP; lab was drawn early and may not be representative of true level. Will continue gtt at current rate and confirm stable with additional level.   Wynona Neat, PharmD, BCPS  05/01/2014,2:57 AM

## 2014-05-01 NOTE — CV Procedure (Addendum)
Left  Heart Catheterization with Coronary Angiography Report  Debra Thompson  74 y.o.  female 03-Jun-1940  Procedure Date: 05/01/2014 Referring Physician: Fransico Him, MD Primary Cardiologist: Loney Hering  INDICATIONS: ACS  PROCEDURE: 1. Left heart catheterization; 2. Coronary angiography; 3. Left ventriculography  CONSENT:  The risks, benefits, and details of the procedure were explained in detail to the patient. Risks including death, stroke, heart attack, kidney injury, allergy, limb ischemia, bleeding and radiation injury were discussed.  The patient verbalized understanding and wanted to proceed.  Informed written consent was obtained.  PROCEDURE TECHNIQUE:  After Xylocaine anesthesia a 5 French Slender sheath was placed in the right radial artery with an angiocath and the modified Seldinger technique.  Coronary angiography was done using a 5 F JR4, JL 3.5, and JL 4 diagnostic catheters.  Left ventriculography was done using the JR 4 catheter and hand injection.   The images were reviewed. The patient has a significant distal LAD stenosis that is relatively discrete. The LAD is calcified with a 90 angle in the mid vessel that would make delivering a distal stent difficult. There is also what appears to be a disrupted eccentric plaque in the proximal vessel that obstructs the LAD by no more than 50%. The left ventricular contractile pattern appears to be consistent with stress cardiomyopathy or completed or stunned ischemic zone.   CONTRAST:  Total of 100 cc.  COMPLICATIONS:  None   HEMODYNAMICS:  Aortic pressure 143/65 mmHg; LV pressure 155/14 mmHg; LVEDP 23 mmHg  ANGIOGRAPHIC DATA:   The left main coronary artery is is relatively short and widely patent..  The left anterior descending artery is is a large vessel that wraps around the left ventricular apex. There is heavy calcification within the mid LAD at the site of the second septal perforator. The entire segment is  calcified. Proximal to this region but after the first septal perforator there is an eccentric 50-60% stenosis. Beyond the region of tortuosity in the distal LAD there is an eccentric 70-90% focal stenosis before the apical LAD.  The left circumflex artery is a large vessel with marked tortuosity noted in each of 2 obtuse marginal branches. No significant obstructive lesions are noted..  The right coronary artery is is dominant and gives origin to a PDA and 2 left ventricular branches. The branches are tortuous.  LEFT VENTRICULOGRAM:  Left ventricular angiogram was done in the 30 RAO projection and revealed distal anterior wall and apical akinesis/dyskinesis. This pattern could represent a previous infarct (? completed) or a stress cardiomyopathy. Patient is having no chest discomfort at the current time. Estimated ejection fraction is 35-40%.   IMPRESSIONS:  1. LAD with heavily calcified and angulated mid segment. The proximal LAD contains a somewhat hazy eccentric 50% stenosis. The distal LAD near the apex contains a focal eccentric 80-90% stenosis. 2. The circumflex and right coronary widely patent. 3. The left ventricle is dysfunctional with anteroapical akinesis/dyskinesis in a pattern that could be considered representative of an infarct versus stress cardiomyopathy (Takotsubo).   RECOMMENDATION:  Recommend medical therapy. If recurring episodes of angina the patient will need to have PCI on the LAD distal lesion and perhaps the more proximal lesion. Recurring angina would imply that there is still viable muscle even though the segment appears akinetic  PCI of the distal lesion will be higher than usual risk because of the heavily calcified and angulated mid segment which may prevent getting a stent to the more distal target  in the LAD.Marland Kitchen

## 2014-05-01 NOTE — Care Management Note (Unsigned)
    Page 1 of 1   05/01/2014     3:31:09 PM CARE MANAGEMENT NOTE 05/01/2014  Patient:  Debra Thompson, Debra Thompson   Account Number:  [id number]  Date Initiated:  05/01/2014  Documentation initiated by:  GRAVES-BIGELOW,Jacquel Mccamish  Subjective/Objective Assessment:   Pt admitted for cp- Nstemi- plan for cath today. Pt has family support at the bedside.     Action/Plan:   CM will continue to monitor for disposition needs   Anticipated DC Date:  05/02/2014   Anticipated DC Plan:  HOME/SELF CARE         Choice offered to / List presented to:             Status of service:   Medicare Important Message given?  YES (If response is "NO", the following Medicare IM given date fields will be blank) Date Medicare IM given:  05/01/2014 Medicare IM given by:  GRAVES-BIGELOW,Cyenna Rebello Date Additional Medicare IM given:   Additional Medicare IM given by:    Discharge Disposition:    Per UR Regulation:  Reviewed for med. necessity/level of care/duration of stay  If discussed at [place] of Stay Meetings, dates discussed:    Comments:

## 2014-05-01 NOTE — Progress Notes (Signed)
Patient Name: Debra Thompson Date of Encounter: 05/01/2014  Primary Cardiologist: New- Dr. Radford Pax   Principal Problem:   NSTEMI (non-ST elevated myocardial infarction) Active Problems:   Hypertension   Hypercholesteremia    SUBJECTIVE  Chest discomfort significantly improved. No SOB  CURRENT MEDS . aspirin  324 mg Oral NOW   Or  . aspirin  300 mg Rectal NOW  . aspirin EC  81 mg Oral Daily  . atorvastatin  10 mg Oral Daily  . diphenhydrAMINE  25 mg Intravenous Pre-Cath  . famotidine (PEPCID) IV  20 mg Intravenous Pre-Cath  . metoprolol tartrate  25 mg Oral BID  . predniSONE  60 mg Oral Pre-Cath  . ramipril  5 mg Oral Daily  . sodium chloride  3 mL Intravenous Q12H    OBJECTIVE  Filed Vitals:   04/30/14 2300 04/30/14 2345 05/01/14 0413 05/01/14 0801  BP: 187/84 175/81 156/79 144/59  Pulse: 68   67  Temp:   98.5 F (36.9 C) 98.2 F (36.8 C)  TempSrc:   Oral Oral  Resp: 17 17 18 15   Height:      Weight:    170 lb 4.8 oz (77.248 kg)  SpO2: 97%  97% 95%    Intake/Output Summary (Last 24 hours) at 05/01/14 0958 Last data filed at 05/01/14 0800  Gross per 24 hour  Intake   58.3 ml  Output   1550 ml  Net -1491.7 ml   Filed Weights   04/30/14 2003 05/01/14 0801  Weight: 176 lb (79.833 kg) 170 lb 4.8 oz (77.248 kg)    PHYSICAL EXAM  General: Pleasant, NAD. Neuro: Alert and oriented X 3. Moves all extremities spontaneously. Psych: Normal affect. HEENT:  Normal  Neck: Supple without bruits or JVD. Lungs:  Resp regular and unlabored, CTA. Heart: RRR no s3, s4, or murmurs. Abdomen: Soft, non-tender, non-distended, BS + x 4.  Extremities: No clubbing, cyanosis or edema. DP/PT/Radials 2+ and equal bilaterally.  Accessory Clinical Findings  CBC  Recent Labs  04/30/14 2015 05/01/14 0103  WBC 8.0 8.6  NEUTROABS  --  5.6  HGB 13.1 12.8  HCT 38.8 37.6  MCV 90.4 88.5  PLT 198 244   Basic Metabolic Panel  Recent Labs  04/30/14 2015 05/01/14 0103   NA 141 140  K 4.1 3.7  CL 109 106  CO2 25 27  GLUCOSE 131* 127*  BUN 11 8  CREATININE 0.58 0.58  CALCIUM 8.9 8.6  MG  --  2.3   Liver Function Tests  Recent Labs  05/01/14 0103  AST 38*  ALT 26  ALKPHOS 53  BILITOT 0.5  PROT 6.7  ALBUMIN 3.6   Cardiac Enzymes  Recent Labs  05/01/14 0103 05/01/14 0540  TROPONINI 4.10* 2.57*   Fasting Lipid Panel  Recent Labs  05/01/14 0103  CHOL 145  HDL 58  LDLCALC 62  TRIG 124  CHOLHDL 2.5   Thyroid Function Tests  Recent Labs  05/01/14 0103  TSH 1.723    TELE NSR with HR 60s, 6 beats run of NSVT this morning    ECG  NSR without significant ST changes, poor R wave progression in the anterior lead  Echocardiogram  pending    Radiology/Studies  Dg Chest Port 1 View  04/30/2014   CLINICAL DATA:  74 year old with chest pain for 1 day.  EXAM: PORTABLE CHEST - 1 VIEW  COMPARISON:  None.  FINDINGS: Heart and mediastinum are within normal limits. The lungs are clear  bilaterally. Bony thorax is intact. Negative for a pneumothorax. There is a round dense structure overlying the medial left clavicle and likely within bone.  IMPRESSION: No acute chest findings.   Electronically Signed   By: Markus Daft M.D.   On: 04/30/2014 20:27    ASSESSMENT AND PLAN  1. NSTEMI  - pending echo today.   - cath this afternoon. Benefit and risk of procedure discussed by Dr. Radford Pax yesterday. No obvious contraindication to DES  - pretreated with benadryl, pepcid and prednisone in anticipation of cath  2. HTN: likely need better control after cath if still high 3. Dyslipidemia: on lipitor 4. Iodine allergy with rash  Ruben Im Pager: [phone]  The patient is for cardiac catheterization this afternoon.  Daryel November, MD

## 2014-05-01 NOTE — Progress Notes (Signed)
ANTICOAGULATION CONSULT NOTE - Follow Up Consult  Pharmacy Consult for Heparin Indication: chest pain/ACS  Allergies  Allergen Reactions  . Iodine Rash    rash    Patient Measurements: Height: 5\' 5"  (165.1 cm) Weight: 170 lb 4.8 oz (77.248 kg) IBW/kg (Calculated) : 57 Heparin Dosing Weight: 73 kg  Vital Signs: Temp: 98.2 F (36.8 C) (02/26 0801) Temp Source: Oral (02/26 0801) BP: 144/59 mmHg (02/26 0801) Pulse Rate: 67 (02/26 0801)  Labs:  Recent Labs  04/30/14 2015 05/01/14 0103 05/01/14 0540 05/01/14 0753  HGB 13.1 12.8  --   --   HCT 38.8 37.6  --   --   PLT 198 224  --   --   APTT  --  70*  --   --   LABPROT  --  13.5  --   --   INR  --  1.02  --   --   HEPARINUNFRC  --  0.42  --  0.53  CREATININE 0.58 0.58  --   --   TROPONINI  --  4.10* 2.57*  --     Estimated Creatinine Clearance: 63.4 mL/min (by C-G formula based on Cr of 0.58).   Medications:  Scheduled:  . aspirin  324 mg Oral NOW   Or  . aspirin  300 mg Rectal NOW  . aspirin EC  81 mg Oral Daily  . atorvastatin  10 mg Oral Daily  . diphenhydrAMINE  25 mg Intravenous Pre-Cath  . famotidine (PEPCID) IV  20 mg Intravenous Pre-Cath  . metoprolol tartrate  25 mg Oral BID  . predniSONE  60 mg Oral Pre-Cath  . ramipril  5 mg Oral Daily  . sodium chloride  3 mL Intravenous Q12H   Infusions:  . sodium chloride 50 mL/hr at 05/01/14 0405  . heparin 850 Units/hr (04/30/14 2134)    Assessment: 74 yo F with NSTEMI.  Heparin is therapeutic at 850 units/hr.  Plan for cath this afternoon.   Goal of Therapy:  Heparin level 0.3-0.7 units/ml Monitor platelets by anticoagulation protocol: Yes   Plan:  Continue heparin at 850 units/hr Daily heparin level and CBC while on heparin Follow up after cath  [facility], Pharm.D., BCPS Clinical Pharmacist Pager [phone] 05/01/2014 9:52 AM

## 2014-05-01 NOTE — Progress Notes (Signed)
UR Completed Gethsemane Fischler Graves-Bigelow, RN,BSN [phone]  

## 2014-05-02 LAB — CBC
HEMATOCRIT: 38.9 % (ref 36.0–46.0)
Hemoglobin: 13.3 g/dL (ref 12.0–15.0)
MCH: 30.7 pg (ref 26.0–34.0)
MCHC: 34.2 g/dL (ref 30.0–36.0)
MCV: 89.8 fL (ref 78.0–100.0)
Platelets: 239 10*3/uL (ref 150–400)
RBC: 4.33 MIL/uL (ref 3.87–5.11)
RDW: 13.1 % (ref 11.5–15.5)
WBC: 15.2 10*3/uL — ABNORMAL HIGH (ref 4.0–10.5)

## 2014-05-02 LAB — HEPARIN LEVEL (UNFRACTIONATED): HEPARIN UNFRACTIONATED: 0.3 [IU]/mL (ref 0.30–0.70)

## 2014-05-02 LAB — HEMOGLOBIN A1C
Hgb A1c MFr Bld: 5.9 % — ABNORMAL HIGH (ref 4.8–5.6)
Mean Plasma Glucose: 123 mg/dL

## 2014-05-02 LAB — TROPONIN I: Troponin I: 2.08 ng/mL (ref <0.031)

## 2014-05-02 MED ORDER — ENOXAPARIN SODIUM 80 MG/0.8ML ~~LOC~~ SOLN
80.0000 mg | Freq: Once | SUBCUTANEOUS | Status: AC
Start: 2014-05-02 — End: 2014-05-02
  Administered 2014-05-02: 80 mg via SUBCUTANEOUS
  Filled 2014-05-02: qty 0.8

## 2014-05-02 MED ORDER — ENOXAPARIN SODIUM 80 MG/0.8ML ~~LOC~~ SOLN
80.0000 mg | Freq: Two times a day (BID) | SUBCUTANEOUS | Status: DC
  Administered 2014-05-03: 80 mg via SUBCUTANEOUS
  Filled 2014-05-02: qty 0.8

## 2014-05-02 MED ORDER — HEPARIN (PORCINE) IN NACL 100-0.45 UNIT/ML-% IJ SOLN
950.0000 [IU]/h | INTRAMUSCULAR | Status: DC
  Administered 2014-05-02: 950 [IU]/h via INTRAVENOUS

## 2014-05-02 MED ORDER — NITROGLYCERIN IN D5W 200-5 MCG/ML-% IV SOLN: 0.0000 ug/min | INTRAVENOUS | Status: DC

## 2014-05-02 MED ORDER — ISOSORBIDE MONONITRATE ER 60 MG PO TB24
60.0000 mg | ORAL_TABLET | Freq: Every day | ORAL | Status: DC
  Administered 2014-05-02 – 2014-05-03 (×2): 60 mg via ORAL
  Filled 2014-05-02 (×2): qty 1

## 2014-05-02 NOTE — Progress Notes (Signed)
CRITICAL VALUE ALERT  Critical value received:  Troponin 2.08  Date of notification:  05/02/14  Time of notification:  2395  Critical value read back:Yes.    Nurse who received alert:  [place]  MD notified (1st page):  Candee Furbish  Time of first page:  1459  MD notified (2nd page):  Time of second page:  Responding MD:  Candee Furbish  Time MD responded:  1501

## 2014-05-02 NOTE — Progress Notes (Signed)
Subjective:  Doing well. No chest pain.   Objective:  Vital Signs in the last 24 hours: Temp:  [97.4 F (36.3 C)-98.4 F (36.9 C)] 97.9 F (36.6 C) (02/27 0952) Pulse Rate:  [62-72] 65 (02/27 0952) Resp:  [13-28] 20 (02/27 0952) BP: (115-175)/(53-134) 128/56 mmHg (02/27 0952) SpO2:  [95 %-98 %] 96 % (02/27 0952) Weight:  [165 lb 4.8 oz (74.98 kg)] 165 lb 4.8 oz (74.98 kg) (02/27 0400)  Intake/Output from previous day: 02/26 0701 - 02/27 0700 In: 1040 [P.O.:240; I.V.:800] Out: 1450 [Urine:1450]   Physical Exam: General: Well developed, well nourished, in no acute distress. Head:  Normocephalic and atraumatic. Lungs: Clear to auscultation and percussion. Heart: Normal S1 and S2.  No murmur, rubs or gallops.  Abdomen: soft, non-tender, positive bowel sounds. Extremities: No clubbing or cyanosis. No edema. Neurologic: Alert and oriented x 3.    Lab Results:  Recent Labs  05/01/14 0103 05/02/14 0420  WBC 8.6 15.2*  HGB 12.8 13.3  PLT 224 239    Recent Labs  04/30/14 2015 05/01/14 0103  NA 141 140  K 4.1 3.7  CL 109 106  CO2 25 27  GLUCOSE 131* 127*  BUN 11 8  CREATININE 0.58 0.58    Recent Labs  05/01/14 0540 05/01/14 1203  TROPONINI 2.57* 3.16*   Hepatic Function Panel  Recent Labs  05/01/14 0103  PROT 6.7  ALBUMIN 3.6  AST 38*  ALT 26  ALKPHOS 53  BILITOT 0.5    Recent Labs  05/01/14 0103  CHOL 145   No results for input(s): PROTIME in the last 72 hours.  Imaging: Dg Chest Port 1 View  04/30/2014   CLINICAL DATA:  74 year old with chest pain for 1 day.  EXAM: PORTABLE CHEST - 1 VIEW  COMPARISON:  None.  FINDINGS: Heart and mediastinum are within normal limits. The lungs are clear bilaterally. Bony thorax is intact. Negative for a pneumothorax. There is a round dense structure overlying the medial left clavicle and likely within bone.  IMPRESSION: No acute chest findings.   Electronically Signed   By: Markus Daft M.D.   On:  04/30/2014 20:27   Personally viewed.   Telemetry: no adverse rhythms Personally viewed.    Cardiac Studies:  Cath 05/01/14:   LEFT VENTRICULOGRAM: Left ventricular angiogram was done in the 30 RAO projection and revealed distal anterior wall and apical akinesis/dyskinesis. This pattern could represent a previous infarct (? completed) or a stress cardiomyopathy. Patient is having no chest discomfort at the current time. Estimated ejection fraction is 35-40%.   IMPRESSIONS: 1. LAD with heavily calcified and angulated mid segment. The proximal LAD contains a somewhat hazy eccentric 50% stenosis. The distal LAD near the apex contains a focal eccentric 80-90% stenosis. 2. The circumflex and right coronary widely patent. 3. The left ventricle is dysfunctional with anteroapical akinesis/dyskinesis in a pattern that could be considered representative of an infarct versus stress cardiomyopathy (Takotsubo).   RECOMMENDATION: Recommend medical therapy. If recurring episodes of angina the patient will need to have PCI on the LAD distal lesion and perhaps the more proximal lesion. Recurring angina would imply that there is still viable muscle even though the segment appears akinetic PCI of the distal lesion will be higher than usual risk because of the heavily calcified and angulated mid segment which may prevent getting a stent to the more distal target in the LAD.Marland Kitchen  Assessment/Plan:  Principal Problem:   NSTEMI (non-ST elevated myocardial infarction) Active Problems:   Hypertension   Hypercholesteremia  74 year old with NSTEMI (Trop 4) with EF 35%, with CAD in LAD heavy calcification in mid segment with distal lesion.   - NSTEMI  - reviewed cath  - discussed with patient  - ambulating today  - if no angina would not be unreasonable to DC tomorrow with medical mgt.    SKAINS, [place] 05/02/2014, 10:44 AM

## 2014-05-02 NOTE — Progress Notes (Addendum)
ANTICOAGULATION CONSULT NOTE - Follow Up Consult  Pharmacy Consult for Heparin Indication: chest pain/ACS  Allergies  Allergen Reactions  . Iodine Rash    rash    Patient Measurements: Height: 5\' 5"  (165.1 cm) Weight: 165 lb 4.8 oz (74.98 kg) IBW/kg (Calculated) : 57 Heparin Dosing Weight: 73 kg  Vital Signs: Temp: 97.9 F (36.6 C) (02/27 0952) Temp Source: Oral (02/27 0952) BP: 128/56 mmHg (02/27 0952) Pulse Rate: 65 (02/27 0952)  Labs:  Recent Labs  04/30/14 2015 05/01/14 0103 05/01/14 0540 05/01/14 0753 05/01/14 1203 05/02/14 0420 05/02/14 0942  HGB 13.1 12.8  --   --   --  13.3  --   HCT 38.8 37.6  --   --   --  38.9  --   PLT 198 224  --   --   --  239  --   APTT  --  70*  --   --   --   --   --   LABPROT  --  13.5  --   --   --   --   --   INR  --  1.02  --   --   --   --   --   HEPARINUNFRC  --  0.42  --  0.53  --   --  0.30  CREATININE 0.58 0.58  --   --   --   --   --   TROPONINI  --  4.10* 2.57*  --  3.16*  --   --     Estimated Creatinine Clearance: 62.5 mL/min (by C-G formula based on Cr of 0.58).   Medications:  Scheduled:  . aspirin EC  81 mg Oral Daily  . aspirin  300 mg Rectal NOW  . atorvastatin  10 mg Oral Daily  . clopidogrel  75 mg Oral Q breakfast  . heparin  2,000 Units Intravenous Once  . isosorbide mononitrate  60 mg Oral Daily  . metoprolol tartrate  25 mg Oral BID  . ramipril  5 mg Oral Daily   Infusions:  . sodium chloride Stopped (05/01/14 1900)  . heparin 850 Units/hr (05/02/14 0319)  . nitroGLYCERIN      Assessment: 74 yo F admitted on 2/25 with CP. Pt is s/p cath on 2/26 with recommendations for medical therapy for now. Heparin reordered after cath and HL is at the low end of goal range at 0.30. No interruptions in gtt per nurse. H/H remains wnl and stable with no reported significant s/s bleeding.   Goal of Therapy:  Heparin level 0.3-0.7 units/ml Monitor platelets by anticoagulation protocol: Yes   Plan:   Increase heparin to 950 units/hr to maintain within goal range Daily heparin level and CBC while on heparin F/u LOT?  Ruta Hinds. Velva Harman, PharmD, [place] Clinical Pharmacist - Resident Pager: [phone] Pharmacy: [phone] 05/02/2014 11:06 AM    ADDENDUM:  - Heparin gtt now to be transitioned to therapeutic lovenox therapy - D/c heparin gtt and start lovenox 80 mg SQ q12h  - Will continue to monitor CBC and for s/s bleeding  Stavros Cail K. Velva Harman, PharmD Clinical Pharmacist - Resident Pager: [phone] Pharmacy: [phone] 05/02/2014 1:37 PM

## 2014-05-02 NOTE — Progress Notes (Signed)
Pt has been very active today. Pt has walked the halls several times, pt is up as tolerated, and has been resting well in chair. Pt has not c/o chest pain today. Will continue to monitor

## 2014-05-02 NOTE — Progress Notes (Signed)
CARDIAC REHAB PHASE I   PRE:  Rate/Rhythm: 63  BP:  Sitting: 122/64     SaO2: 96% RA  MODE:  Ambulation: 1100 ft   POST:  Rate/Rhythm: 60  BP:  Sitting: 138/64     SaO2: 97% RA  10:35am-11:35 am Patient ambulated at a steady pace independently.  Patient did not complain of any chest pain or shortness of breath.  Conversated entire time of ambulation. Patient placed in chair with call bell in reach and daughter at her side.  Patient was educated on cardiac rehab, risk factors, exercise, NTG, restrictions, and nutrition.  Patient states that she is interested in cardiac rehab.    Debra Thompson, [place], [place] 05/02/2014 11:34 AM

## 2014-05-03 ENCOUNTER — Encounter (HOSPITAL_COMMUNITY): Payer: Self-pay | Admitting: Cardiology

## 2014-05-03 DIAGNOSIS — I25119 Atherosclerotic heart disease of native coronary artery with unspecified angina pectoris: Secondary | ICD-10-CM | POA: Diagnosis present

## 2014-05-03 DIAGNOSIS — I2510 Atherosclerotic heart disease of native coronary artery without angina pectoris: Secondary | ICD-10-CM

## 2014-05-03 DIAGNOSIS — I10 Essential (primary) hypertension: Secondary | ICD-10-CM

## 2014-05-03 DIAGNOSIS — Z8249 Family history of ischemic heart disease and other diseases of the circulatory system: Secondary | ICD-10-CM

## 2014-05-03 DIAGNOSIS — I119 Hypertensive heart disease without heart failure: Secondary | ICD-10-CM | POA: Diagnosis present

## 2014-05-03 LAB — CBC
HEMATOCRIT: 35.3 % — AB (ref 36.0–46.0)
Hemoglobin: 12.2 g/dL (ref 12.0–15.0)
MCH: 30.7 pg (ref 26.0–34.0)
MCHC: 34.6 g/dL (ref 30.0–36.0)
MCV: 88.9 fL (ref 78.0–100.0)
Platelets: 193 10*3/uL (ref 150–400)
RBC: 3.97 MIL/uL (ref 3.87–5.11)
RDW: 13.3 % (ref 11.5–15.5)
WBC: 7 10*3/uL (ref 4.0–10.5)

## 2014-05-03 MED ORDER — MAGNESIUM HYDROXIDE 400 MG/5ML PO SUSP
5.0000 mL | Freq: Every day | ORAL | Status: DC | PRN
  Administered 2014-05-03: 5 mL via ORAL
  Filled 2014-05-03 (×2): qty 30

## 2014-05-03 MED ORDER — RAMIPRIL 5 MG PO CAPS: 5.0000 mg | ORAL_CAPSULE | Freq: Every day | ORAL | Status: DC

## 2014-05-03 MED ORDER — NITROGLYCERIN 0.4 MG SL SUBL: 0.4000 mg | SUBLINGUAL_TABLET | SUBLINGUAL | Status: DC | PRN

## 2014-05-03 MED ORDER — METOPROLOL TARTRATE 25 MG PO TABS: 25.0000 mg | ORAL_TABLET | Freq: Two times a day (BID) | ORAL | Status: DC

## 2014-05-03 MED ORDER — ISOSORBIDE MONONITRATE ER 60 MG PO TB24: 60.0000 mg | ORAL_TABLET | Freq: Every day | ORAL | Status: DC

## 2014-05-03 MED ORDER — ASPIRIN 81 MG PO TBEC: 81.0000 mg | DELAYED_RELEASE_TABLET | Freq: Every day | ORAL | Status: AC

## 2014-05-03 MED ORDER — ACETAMINOPHEN 325 MG PO TABS: 650.0000 mg | ORAL_TABLET | ORAL | Status: DC | PRN

## 2014-05-03 MED ORDER — CLOPIDOGREL BISULFATE 75 MG PO TABS: 75.0000 mg | ORAL_TABLET | Freq: Every day | ORAL | Status: DC

## 2014-05-03 NOTE — Discharge Instructions (Signed)
Myocardial Infarction  A myocardial infarction (MI) is also called a heart attack. It causes damage to the heart that cannot be fixed. An MI often happens when a blood clot or other blockage cuts blood flow to the heart. When this happens, certain areas of the heart begin to die. This is an emergency.  HOME CARE  · Take medicine as told by your doctor.  · Change certain behaviors as told by your doctor. This may include:  ¨ Quitting smoking.  ¨ Being active.  ¨ Keeping a healthy weight.  ¨ Eating a heart-healthy diet. Ask your doctor for help with this diet.  ¨ Keeping your diabetes under control.  ¨ Lessening stress.  ¨ Limiting how much alcohol you drink.  GET HELP RIGHT AWAY IF:  · You have crushing or pressure-like chest pain that spreads to the arms, back, neck, or jaw. Call your local emergency services (911 in [place] Do not drive yourself to the hospital.  · You have severe chest pain.  · You have shortness of breath during rest, sleep, or with activity.  · You have sudden sweating or clammy skin.  · You feel sick to your stomach (nauseous) and throw up (vomit).  · You suddenly get lightheaded or dizzy.  · You feel your heart beating fast or skipping beats.  MAKE SURE YOU:   · Understand these instructions.  · Will watch your condition.  · Will get help right away if you are not doing well or get worse.  Document Released: 08/22/2011 Document Reviewed: 04/25/2013  [employer]® Patient Information ©2015 [employer]. This information is not intended to replace advice given to you by your health care provider. Make sure you discuss any questions you have with your health care provider.

## 2014-05-03 NOTE — Discharge Summary (Signed)
Patient ID: Debra Thompson,  MRN: [id number], DOB/AGE: 1940-04-09 74 y.o.  Admit date: 04/30/2014 Discharge date: 05/03/2014  Primary Care Provider: Haywood Pao, MD Primary Cardiologist: Dr Radford Pax (new)  Discharge Diagnoses Principal Problem:   NSTEMI (non-ST elevated myocardial infarction) Active Problems:   CAD of distal and mid LAD- medical Rx   Hypertension   Hypercholesteremia   Family history of early CAD   HTN CV disease-mild LVH, grade 1 DD    Procedures: Coronary angiogram 05/01/14   Hospital Course:  74yo female with a history of HTN and dyslipidemia who presented to the ER 04/30/14 with chest pain c/w [place]. She was admitted to step down and placed on ASA, Heparin, nitrates. She ruled in for a NSTEMI with a peak Troponin of 4.10. Echo done 05/01/14 showed her LVEF approximately 55% with a small region of apical hypokinesis/akinesis. It was decided to proceed with diagnostic cath. This was done 05/01/14 and revealed the LAD had heavily a calcified and angulated mid segment. The proximal LAD contained a somewhat hazy eccentric 50% stenosis. The distal LAD near the apex contained a focal eccentric 80-90% stenosis. The circumflex and right coronary widely patent. The left ventricle was dysfunctional with anteroapical akinesis/dyskinesis in a pattern that could be considered representative of an infarct versus stress cardiomyopathy (Takotsubo). The pt was transferred to telemetry and ambulated. She was seen the morning of the 28 th and felt to be stable for discharge. We resumed her home meds except for HCTZ as Imdur and Lopressor are new. This may need to be resumed in the future. She'll follow up with Dr Radford Pax as an OP.   Discharge Vitals:  Blood pressure 126/48, pulse 69, temperature 98.8 F (37.1 C), temperature source Oral, resp. rate 20, height 5\' 5"  (1.651 m), weight 170 lb 9.6 oz (77.384 kg), SpO2 98 %.    Labs: Results for orders placed or performed during the  hospital encounter of 04/30/14 (from the past 24 hour(s))  Heparin level (unfractionated)     Status: None   Collection Time: 05/02/14  9:42 AM  Result Value Ref Range   Heparin Unfractionated 0.30 0.30 - 0.70 IU/mL  Troponin I     Status: Abnormal   Collection Time: 05/02/14  1:25 PM  Result Value Ref Range   Troponin I 2.08 (HH) <0.031 ng/mL    Disposition:      Follow-up Information    Follow up with Sueanne Margarita, MD.   Specialty:  Cardiology   Why:  office will call you   Contact information:   [address] [place] [address] [phone]       Discharge Medications:    Medication List    STOP taking these medications        hydrochlorothiazide 25 MG tablet  Commonly known as:  HYDRODIURIL      TAKE these medications        acetaminophen 325 MG tablet  Commonly known as:  TYLENOL  Take 2 tablets (650 mg total) by mouth every 4 (four) hours as needed for headache or mild pain.     aspirin 81 MG EC tablet  Take 1 tablet (81 mg total) by mouth daily.     atorvastatin 20 MG tablet  Commonly known as:  LIPITOR  Take 10 mg by mouth daily.     cholecalciferol 1000 UNITS tablet  Commonly known as:  VITAMIN D  Take 1,000 Units by mouth daily.  clopidogrel 75 MG tablet  Commonly known as:  PLAVIX  Take 1 tablet (75 mg total) by mouth daily with breakfast.     Co Q 10 100 MG Caps  Take 100 mg by mouth daily.     Fish Oil 1000 MG Caps  Take 1,000 mg by mouth daily.     isosorbide mononitrate 60 MG 24 hr tablet  Commonly known as:  IMDUR  Take 1 tablet (60 mg total) by mouth daily.     loratadine 10 MG tablet  Commonly known as:  CLARITIN  Take 10 mg by mouth daily.     metoprolol tartrate 25 MG tablet  Commonly known as:  LOPRESSOR  Take 1 tablet (25 mg total) by mouth 2 (two) times daily.     nitroGLYCERIN 0.4 MG SL tablet  Commonly known as:  NITROSTAT  Place 1 tablet (0.4 mg total) under the tongue every 5 (five) minutes x  3 doses as needed for chest pain.     ONE-A-DAY 55 PLUS PO  Take by mouth daily.     ramipril 5 MG tablet  Commonly known as:  ALTACE  Take 5 mg by mouth daily.         Duration of Discharge Encounter: Greater than 30 minutes including physician time.  Angelena Form PA-C 05/03/2014 8:24 AM

## 2014-05-03 NOTE — Progress Notes (Signed)
    Subjective:  No chest pain, up without problems.  Objective:  Vital Signs in the last 24 hours: Temp:  [97.9 F (36.6 C)-98.5 F (36.9 C)] 98.3 F (36.8 C) (02/28 0400) Pulse Rate:  [51-68] 63 (02/28 0400) Resp:  [18-20] 18 (02/28 0400) BP: (107-128)/(48-59) 124/59 mmHg (02/28 0400) SpO2:  [96 %-98 %] 97 % (02/28 0400) Weight:  [170 lb 9.6 oz (77.384 kg)] 170 lb 9.6 oz (77.384 kg) (02/28 0400)  Intake/Output from previous day:  Intake/Output Summary (Last 24 hours) at 05/03/14 0808 Last data filed at 05/02/14 1858  Gross per 24 hour  Intake    820 ml  Output    850 ml  Net    -30 ml    Physical Exam: General appearance: alert, cooperative, no distress and mildly obese Lungs: clear to auscultation bilaterally Heart: regular rate and rhythm Extremities: Rt wrist without hematoma   Rate: 62  Rhythm: normal sinus rhythm  Lab Results:  Recent Labs  05/01/14 0103 05/02/14 0420  WBC 8.6 15.2*  HGB 12.8 13.3  PLT 224 239    Recent Labs  04/30/14 2015 05/01/14 0103  NA 141 140  K 4.1 3.7  CL 109 106  CO2 25 27  GLUCOSE 131* 127*  BUN 11 8  CREATININE 0.58 0.58    Recent Labs  05/01/14 1203 05/02/14 1325  TROPONINI 3.16* 2.08*    Recent Labs  05/01/14 0103  INR 1.02    Imaging: Imaging results have been reviewed  Cardiac Studies:  Assessment/Plan:  74yo female with a history of HTN and dyslipidemia who presented to the ER 04/30/14 with chest pain c/w [place]. She was admitted to step down and placed on ASA, Heparin, nitrates. She ruled in for a NSTEMI with a peak Troponin of 4.10. Echo done 05/01/14 showed her LVEF approximately 55% with a small region of apical hypokinesis/akinesis. It was decided to proceed with diagnostic cath. This was done 05/01/14 and revealed the LAD had heavily a calcified and angulated mid segment. The proximal LAD contained a somewhat hazy eccentric 50% stenosis. The distal LAD near the apex contained a focal eccentric  80-90% stenosis. The circumflex and right coronary widely patent. The left ventricle was dysfunctional with anteroapical akinesis/dyskinesis in a pattern that could be considered representative of an infarct versus stress cardiomyopathy (Takotsubo). The pt was transferred to telemetry and ambulated.   Principal Problem:   NSTEMI (non-ST elevated myocardial infarction) Active Problems:   CAD of distal and mid LAD- medical Rx   Hypertension   Hypercholesteremia   Family history of early CAD   HTN CV disease-mild LVH, grade 1 DD   PLAN: Should be OK for discharge- MD to see. F/U with Dr Radford Pax as an OP.  Kerin Ransom PA-C Beeper [phone] 05/03/2014, 8:08 AM  Personally seen and examined. Agree with above. Several questions answered.  NTG PRN, Imdur Bb, ACE Plavix ASA Walked hallway several times yesterday with no difficulty RRR, CTAB  OK for DC. Close f/u. Call if any concerns.  Daughters in room.   Candee Furbish, MD

## 2014-05-06 ENCOUNTER — Telehealth: Payer: Self-pay | Admitting: Internal Medicine

## 2014-05-06 ENCOUNTER — Telehealth: Payer: Self-pay | Admitting: Cardiology

## 2014-05-06 NOTE — Telephone Encounter (Signed)
Increase Imdur to 90mg  daily and have her call if she has any further pain

## 2014-05-06 NOTE — Telephone Encounter (Signed)
Patient states she was sitting in her recliner at 0115 watching TV and she had pain in her back 6/10 and SOB. Patient took a baby ASA and 2 nitro and the episode relieved itself in a few minutes. She went to sleep and has not had any symptoms since the episode last night.  To Dr. Radford Pax.

## 2014-05-06 NOTE — Telephone Encounter (Signed)
New message      Pt c/o Shortness Of Breath: STAT if SOB developed within the last 24 hours or pt is noticeably SOB on the phone  1. Are you currently SOB (can you hear that pt is SOB on the phone)?  no  2. How long have you been experiencing SOB? Last night at 2am. Pt was "gasping" for air.  She had pain in her back  3. Are you SOB when sitting or when up moving around?   4. Are you currently experiencing any other symptoms? No Pt took 2 nitro (10-15 min apart) and it got better Pt saw Dr Radford Pax in the ER and Dr Tamala Julian did her cath----not sure who is her doctor

## 2014-05-06 NOTE — Telephone Encounter (Signed)
Called by [name].  She reports anginal pain similar to her recent hospitalization, though less in intensity.  Per review of her chart she was recently admitted with an NSTEMI and found to have a 50% mid LAD lesion amd 80% distal apical LAD lesion, which was decided to be treated medically.  She has taken 3 aspirin and was asking if it was ok to take NTG.  I instructed her to try taking SL NTG q79min until chest pain improved.  If it did not improve after 3 attempts to come into the hospital.  Also instructed her to follow up with her Cardiologist later this week.  Debra Koyanagi, MD

## 2014-05-07 ENCOUNTER — Encounter: Payer: Medicare Other | Admitting: Physician Assistant

## 2014-05-07 MED ORDER — ISOSORBIDE MONONITRATE ER 60 MG PO TB24: 60.0000 mg | ORAL_TABLET | Freq: Every day | ORAL | Status: DC

## 2014-05-07 MED ORDER — ISOSORBIDE MONONITRATE ER 30 MG PO TB24: 30.0000 mg | ORAL_TABLET | Freq: Every day | ORAL | Status: DC

## 2014-05-07 MED ORDER — ATORVASTATIN CALCIUM 10 MG PO TABS: 10.0000 mg | ORAL_TABLET | Freq: Every day | ORAL | Status: DC

## 2014-05-07 NOTE — Telephone Encounter (Signed)
Called patient to inform her of medication change. Patient also wanted a sooner appointment because of her recent episode of angina. Dr. Karlyn Agee informed patient to see cardiologist by the end of this week. Patient's appointment is on 05/18/14. Patient's daughter came on the phone and stated this would not do, that her mother needed to be seen sooner than this date. Consulted scheduling for openings and will give patient a call back with feedback.

## 2014-05-08 ENCOUNTER — Encounter: Payer: Self-pay | Admitting: Interventional Cardiology

## 2014-05-08 ENCOUNTER — Ambulatory Visit (INDEPENDENT_AMBULATORY_CARE_PROVIDER_SITE_OTHER): Payer: Medicare Other | Admitting: Interventional Cardiology

## 2014-05-08 VITALS — BP 122/50 | HR 69 | Ht 65.0 in | Wt 167.0 lb

## 2014-05-08 DIAGNOSIS — I25119 Atherosclerotic heart disease of native coronary artery with unspecified angina pectoris: Secondary | ICD-10-CM

## 2014-05-08 DIAGNOSIS — I214 Non-ST elevation (NSTEMI) myocardial infarction: Secondary | ICD-10-CM

## 2014-05-08 DIAGNOSIS — I10 Essential (primary) hypertension: Secondary | ICD-10-CM

## 2014-05-08 DIAGNOSIS — I5032 Chronic diastolic (congestive) heart failure: Secondary | ICD-10-CM

## 2014-05-08 NOTE — Patient Instructions (Signed)
Your physician recommends that you continue on your current medications as directed. Please refer to the Current Medication list given to you today.  You have been referred to [facility].   You have follow up appointment on 05/25/14 @ 10:45am
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
## 2014-05-14 ENCOUNTER — Encounter (HOSPITAL_COMMUNITY)
Admission: RE | Admit: 2014-05-14 | Discharge: 2014-05-14 | Disposition: A | Discharge status: Home / Self Care | Payer: Medicare Other | Source: Ambulatory Visit | Attending: Interventional Cardiology | Admitting: Interventional Cardiology

## 2014-05-14 DIAGNOSIS — Z8249 Family history of ischemic heart disease and other diseases of the circulatory system: Secondary | ICD-10-CM | POA: Insufficient documentation

## 2014-05-14 DIAGNOSIS — Z823 Family history of stroke: Secondary | ICD-10-CM | POA: Insufficient documentation

## 2014-05-14 DIAGNOSIS — Z91041 Radiographic dye allergy status: Secondary | ICD-10-CM | POA: Insufficient documentation

## 2014-05-14 DIAGNOSIS — E780 Pure hypercholesterolemia: Secondary | ICD-10-CM | POA: Insufficient documentation

## 2014-05-14 DIAGNOSIS — Z79899 Other long term (current) drug therapy: Secondary | ICD-10-CM | POA: Insufficient documentation

## 2014-05-14 DIAGNOSIS — I25119 Atherosclerotic heart disease of native coronary artery with unspecified angina pectoris: Secondary | ICD-10-CM | POA: Insufficient documentation

## 2014-05-14 DIAGNOSIS — M1990 Unspecified osteoarthritis, unspecified site: Secondary | ICD-10-CM | POA: Insufficient documentation

## 2014-05-14 DIAGNOSIS — Z87891 Personal history of nicotine dependence: Secondary | ICD-10-CM | POA: Insufficient documentation

## 2014-05-14 DIAGNOSIS — Z5189 Encounter for other specified aftercare: Secondary | ICD-10-CM | POA: Insufficient documentation

## 2014-05-14 DIAGNOSIS — E785 Hyperlipidemia, unspecified: Secondary | ICD-10-CM | POA: Insufficient documentation

## 2014-05-14 DIAGNOSIS — I252 Old myocardial infarction: Secondary | ICD-10-CM | POA: Insufficient documentation

## 2014-05-14 DIAGNOSIS — I10 Essential (primary) hypertension: Secondary | ICD-10-CM | POA: Insufficient documentation

## 2014-05-14 NOTE — Progress Notes (Signed)
Cardiac Rehab Medication Review by a Pharmacist  Does the patient  feel that his/her medications are working for him/her?  yes  Has the patient been experiencing any side effects to the medications prescribed?  no  Does the patient measure his/her own blood pressure or blood glucose at home?  no   Does the patient have any problems obtaining medications due to transportation or finances?   no  Understanding of regimen: good Understanding of indications: good Potential of compliance: good   Pharmacist comments: She has no barriers to obtaining her medications.  She feels her medications are working well for her, and she is not experiencing any side effects. She does not currently measure her blood pressure at home, but she is going to start using her husband's blood pressure monitor. She does not have any questions for me today.  Cassie L. Nicole Kindred, PharmD Clinical Pharmacy Resident Pager: [phone] 05/14/2014 8:25 AM

## 2014-05-18 ENCOUNTER — Encounter: Payer: Medicare Other | Admitting: Physician Assistant

## 2014-05-18 ENCOUNTER — Encounter (HOSPITAL_COMMUNITY)
Admission: RE | Admit: 2014-05-18 | Discharge: 2014-05-18 | Disposition: A | Discharge status: Home / Self Care | Payer: Medicare Other | Source: Ambulatory Visit | Attending: Interventional Cardiology | Admitting: Interventional Cardiology

## 2014-05-18 ENCOUNTER — Encounter (HOSPITAL_COMMUNITY): Payer: Medicare Other

## 2014-05-18 DIAGNOSIS — Z5189 Encounter for other specified aftercare: Secondary | ICD-10-CM | POA: Diagnosis present

## 2014-05-18 DIAGNOSIS — M1990 Unspecified osteoarthritis, unspecified site: Secondary | ICD-10-CM | POA: Diagnosis not present

## 2014-05-18 DIAGNOSIS — Z823 Family history of stroke: Secondary | ICD-10-CM | POA: Diagnosis not present

## 2014-05-18 DIAGNOSIS — Z87891 Personal history of nicotine dependence: Secondary | ICD-10-CM | POA: Diagnosis not present

## 2014-05-18 DIAGNOSIS — Z91041 Radiographic dye allergy status: Secondary | ICD-10-CM | POA: Diagnosis not present

## 2014-05-18 DIAGNOSIS — E780 Pure hypercholesterolemia: Secondary | ICD-10-CM | POA: Diagnosis not present

## 2014-05-18 DIAGNOSIS — E785 Hyperlipidemia, unspecified: Secondary | ICD-10-CM | POA: Diagnosis not present

## 2014-05-18 DIAGNOSIS — I10 Essential (primary) hypertension: Secondary | ICD-10-CM | POA: Diagnosis not present

## 2014-05-18 DIAGNOSIS — Z8249 Family history of ischemic heart disease and other diseases of the circulatory system: Secondary | ICD-10-CM | POA: Diagnosis not present

## 2014-05-18 DIAGNOSIS — I25119 Atherosclerotic heart disease of native coronary artery with unspecified angina pectoris: Secondary | ICD-10-CM | POA: Diagnosis not present

## 2014-05-18 DIAGNOSIS — I252 Old myocardial infarction: Secondary | ICD-10-CM | POA: Diagnosis not present

## 2014-05-18 DIAGNOSIS — Z79899 Other long term (current) drug therapy: Secondary | ICD-10-CM | POA: Diagnosis not present

## 2014-05-18 NOTE — Progress Notes (Signed)
Pt started cardiac rehab today.  Pt tolerated light exercise without difficulty. Telemetry rhythm Sinus. Vital signs stable. PHQ=0. Debra Thompson's short and long term goals are to increase strength and energy, to strengthen heart and not get winded. Will continue to monitor the patient throughout  the program.

## 2014-05-20 ENCOUNTER — Encounter (HOSPITAL_COMMUNITY)
Admission: RE | Admit: 2014-05-20 | Discharge: 2014-05-20 | Disposition: A | Discharge status: Home / Self Care | Payer: Medicare Other | Source: Ambulatory Visit | Attending: Interventional Cardiology | Admitting: Interventional Cardiology

## 2014-05-20 ENCOUNTER — Encounter (HOSPITAL_COMMUNITY): Payer: Medicare Other

## 2014-05-20 DIAGNOSIS — Z5189 Encounter for other specified aftercare: Secondary | ICD-10-CM | POA: Diagnosis not present

## 2014-05-22 ENCOUNTER — Encounter (HOSPITAL_COMMUNITY)
Admission: RE | Admit: 2014-05-22 | Discharge: 2014-05-22 | Disposition: A | Discharge status: Home / Self Care | Payer: Medicare Other | Source: Ambulatory Visit | Attending: Interventional Cardiology | Admitting: Interventional Cardiology

## 2014-05-22 ENCOUNTER — Encounter (HOSPITAL_COMMUNITY): Payer: Medicare Other

## 2014-05-22 DIAGNOSIS — Z5189 Encounter for other specified aftercare: Secondary | ICD-10-CM | POA: Diagnosis not present

## 2014-05-25 ENCOUNTER — Encounter: Payer: Self-pay | Admitting: Interventional Cardiology

## 2014-05-25 ENCOUNTER — Encounter (HOSPITAL_COMMUNITY): Payer: Medicare Other

## 2014-05-25 ENCOUNTER — Ambulatory Visit (INDEPENDENT_AMBULATORY_CARE_PROVIDER_SITE_OTHER): Payer: Medicare Other | Admitting: Interventional Cardiology

## 2014-05-25 VITALS — BP 122/68 | HR 56 | Ht 65.0 in | Wt 168.0 lb

## 2014-05-25 DIAGNOSIS — I5032 Chronic diastolic (congestive) heart failure: Secondary | ICD-10-CM | POA: Diagnosis not present

## 2014-05-25 NOTE — Patient Instructions (Signed)
Your physician recommends that you continue on your current medications as directed. Please refer to the Current Medication list given to you today.  Your physician has requested that you have an echocardiogram. Echocardiography is a painless test that uses sound waves to create images of your heart. It provides your doctor with information about the size and shape of your heart and how well your heart's chambers and valves are working. This procedure takes approximately one hour. There are no restrictions for this procedure.( To be scheduled in 2 weeks)  Ok to liberalize activity. Call the office if your are having chest pain.  Your physician recommends that you schedule a follow-up appointment in: 2 months with Dr.Smith
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
## 2014-05-27 ENCOUNTER — Encounter (HOSPITAL_COMMUNITY)
Admission: RE | Admit: 2014-05-27 | Discharge: 2014-05-27 | Disposition: A | Discharge status: Home / Self Care | Payer: Medicare Other | Source: Ambulatory Visit | Attending: Interventional Cardiology | Admitting: Interventional Cardiology

## 2014-05-27 ENCOUNTER — Encounter (HOSPITAL_COMMUNITY): Payer: Medicare Other

## 2014-05-27 DIAGNOSIS — Z5189 Encounter for other specified aftercare: Secondary | ICD-10-CM | POA: Diagnosis not present

## 2014-05-27 NOTE — Progress Notes (Signed)
Academic intern reviewed home exercise with pt today.  Pt plans to walk at home for exercise.  Reviewed THR, pulse, RPE, sign and symptoms, NTG use, and when to call 911 or MD.  Pt voiced understanding. Shirlyn Goltz, Academic Intern Alberteen Sam, MA, ACSM RCEP

## 2014-05-29 ENCOUNTER — Encounter (HOSPITAL_COMMUNITY)
Admission: RE | Admit: 2014-05-29 | Discharge: 2014-05-29 | Disposition: A | Discharge status: Home / Self Care | Payer: Medicare Other | Source: Ambulatory Visit | Attending: Interventional Cardiology | Admitting: Interventional Cardiology

## 2014-05-29 ENCOUNTER — Encounter (HOSPITAL_COMMUNITY): Payer: Medicare Other

## 2014-05-29 DIAGNOSIS — Z5189 Encounter for other specified aftercare: Secondary | ICD-10-CM | POA: Diagnosis not present

## 2014-06-01 ENCOUNTER — Encounter (HOSPITAL_COMMUNITY)
Admission: RE | Admit: 2014-06-01 | Discharge: 2014-06-01 | Disposition: A | Discharge status: Home / Self Care | Payer: Medicare Other | Source: Ambulatory Visit | Attending: Interventional Cardiology | Admitting: Interventional Cardiology

## 2014-06-01 ENCOUNTER — Encounter (HOSPITAL_COMMUNITY): Payer: Medicare Other

## 2014-06-01 DIAGNOSIS — Z5189 Encounter for other specified aftercare: Secondary | ICD-10-CM | POA: Diagnosis not present

## 2014-06-03 ENCOUNTER — Encounter (HOSPITAL_COMMUNITY)
Admission: RE | Admit: 2014-06-03 | Discharge: 2014-06-03 | Disposition: A | Discharge status: Home / Self Care | Payer: Medicare Other | Source: Ambulatory Visit | Attending: Interventional Cardiology | Admitting: Interventional Cardiology

## 2014-06-03 ENCOUNTER — Encounter (HOSPITAL_COMMUNITY): Payer: Medicare Other

## 2014-06-03 DIAGNOSIS — Z5189 Encounter for other specified aftercare: Secondary | ICD-10-CM | POA: Diagnosis not present

## 2014-06-05 ENCOUNTER — Encounter (HOSPITAL_COMMUNITY)
Admission: RE | Admit: 2014-06-05 | Discharge: 2014-06-05 | Disposition: A | Discharge status: Home / Self Care | Payer: Medicare Other | Source: Ambulatory Visit | Attending: Interventional Cardiology | Admitting: Interventional Cardiology

## 2014-06-05 ENCOUNTER — Encounter (HOSPITAL_COMMUNITY): Payer: Medicare Other

## 2014-06-05 DIAGNOSIS — E785 Hyperlipidemia, unspecified: Secondary | ICD-10-CM | POA: Diagnosis not present

## 2014-06-05 DIAGNOSIS — Z823 Family history of stroke: Secondary | ICD-10-CM | POA: Insufficient documentation

## 2014-06-05 DIAGNOSIS — I10 Essential (primary) hypertension: Secondary | ICD-10-CM | POA: Diagnosis not present

## 2014-06-05 DIAGNOSIS — Z87891 Personal history of nicotine dependence: Secondary | ICD-10-CM | POA: Insufficient documentation

## 2014-06-05 DIAGNOSIS — E780 Pure hypercholesterolemia: Secondary | ICD-10-CM | POA: Diagnosis not present

## 2014-06-05 DIAGNOSIS — M1990 Unspecified osteoarthritis, unspecified site: Secondary | ICD-10-CM | POA: Diagnosis not present

## 2014-06-05 DIAGNOSIS — Z79899 Other long term (current) drug therapy: Secondary | ICD-10-CM | POA: Insufficient documentation

## 2014-06-05 DIAGNOSIS — Z8249 Family history of ischemic heart disease and other diseases of the circulatory system: Secondary | ICD-10-CM | POA: Insufficient documentation

## 2014-06-05 DIAGNOSIS — Z5189 Encounter for other specified aftercare: Secondary | ICD-10-CM | POA: Insufficient documentation

## 2014-06-05 DIAGNOSIS — I252 Old myocardial infarction: Secondary | ICD-10-CM | POA: Diagnosis not present

## 2014-06-05 DIAGNOSIS — I25119 Atherosclerotic heart disease of native coronary artery with unspecified angina pectoris: Secondary | ICD-10-CM | POA: Diagnosis not present

## 2014-06-05 DIAGNOSIS — Z91041 Radiographic dye allergy status: Secondary | ICD-10-CM | POA: Insufficient documentation

## 2014-06-08 ENCOUNTER — Encounter (HOSPITAL_COMMUNITY)
Admission: RE | Admit: 2014-06-08 | Discharge: 2014-06-08 | Disposition: A | Discharge status: Home / Self Care | Payer: Medicare Other | Source: Ambulatory Visit | Attending: Interventional Cardiology | Admitting: Interventional Cardiology

## 2014-06-08 ENCOUNTER — Encounter (HOSPITAL_COMMUNITY): Payer: Medicare Other

## 2014-06-08 ENCOUNTER — Ambulatory Visit (HOSPITAL_COMMUNITY): Payer: Medicare Other | Attending: Cardiology | Admitting: Radiology

## 2014-06-08 DIAGNOSIS — I5032 Chronic diastolic (congestive) heart failure: Secondary | ICD-10-CM | POA: Diagnosis not present

## 2014-06-08 DIAGNOSIS — Z5189 Encounter for other specified aftercare: Secondary | ICD-10-CM | POA: Diagnosis not present

## 2014-06-08 NOTE — Progress Notes (Signed)
Echocardiogram performed.  

## 2014-06-10 ENCOUNTER — Telehealth: Payer: Self-pay | Admitting: Interventional Cardiology

## 2014-06-10 ENCOUNTER — Encounter (HOSPITAL_COMMUNITY): Payer: Medicare Other

## 2014-06-10 ENCOUNTER — Encounter (HOSPITAL_COMMUNITY)
Admission: RE | Admit: 2014-06-10 | Discharge: 2014-06-10 | Disposition: A | Discharge status: Home / Self Care | Payer: Medicare Other | Source: Ambulatory Visit | Attending: Interventional Cardiology | Admitting: Interventional Cardiology

## 2014-06-10 DIAGNOSIS — Z5189 Encounter for other specified aftercare: Secondary | ICD-10-CM | POA: Diagnosis not present

## 2014-06-10 NOTE — Telephone Encounter (Signed)
Spoke with pt and reviewed echo results with her.

## 2014-06-10 NOTE — Telephone Encounter (Signed)
New Message    Patient is returning a call that was placed to her today about test results for an echocardiogram.  Please give patient a call back.

## 2014-06-12 ENCOUNTER — Encounter (HOSPITAL_COMMUNITY)
Admission: RE | Admit: 2014-06-12 | Discharge: 2014-06-12 | Disposition: A | Discharge status: Home / Self Care | Payer: Medicare Other | Source: Ambulatory Visit | Attending: Interventional Cardiology | Admitting: Interventional Cardiology

## 2014-06-12 ENCOUNTER — Encounter (HOSPITAL_COMMUNITY): Payer: Medicare Other

## 2014-06-12 DIAGNOSIS — Z5189 Encounter for other specified aftercare: Secondary | ICD-10-CM | POA: Diagnosis not present

## 2014-06-15 ENCOUNTER — Encounter (HOSPITAL_COMMUNITY): Payer: Medicare Other

## 2014-06-15 ENCOUNTER — Encounter (HOSPITAL_COMMUNITY)
Admission: RE | Admit: 2014-06-15 | Discharge: 2014-06-15 | Disposition: A | Discharge status: Home / Self Care | Payer: Medicare Other | Source: Ambulatory Visit | Attending: Interventional Cardiology | Admitting: Interventional Cardiology

## 2014-06-15 DIAGNOSIS — Z5189 Encounter for other specified aftercare: Secondary | ICD-10-CM | POA: Diagnosis not present

## 2014-06-17 ENCOUNTER — Encounter (HOSPITAL_COMMUNITY)
Admission: RE | Admit: 2014-06-17 | Discharge: 2014-06-17 | Disposition: A | Discharge status: Home / Self Care | Payer: Medicare Other | Source: Ambulatory Visit | Attending: Interventional Cardiology | Admitting: Interventional Cardiology

## 2014-06-17 ENCOUNTER — Encounter (HOSPITAL_COMMUNITY): Payer: Medicare Other

## 2014-06-17 DIAGNOSIS — Z5189 Encounter for other specified aftercare: Secondary | ICD-10-CM | POA: Diagnosis not present

## 2014-06-19 ENCOUNTER — Encounter (HOSPITAL_COMMUNITY): Payer: Medicare Other

## 2014-06-19 ENCOUNTER — Encounter (HOSPITAL_COMMUNITY)
Admission: RE | Admit: 2014-06-19 | Discharge: 2014-06-19 | Disposition: A | Discharge status: Home / Self Care | Payer: Medicare Other | Source: Ambulatory Visit | Attending: Interventional Cardiology | Admitting: Interventional Cardiology

## 2014-06-19 DIAGNOSIS — Z5189 Encounter for other specified aftercare: Secondary | ICD-10-CM | POA: Diagnosis not present

## 2014-06-22 ENCOUNTER — Encounter (HOSPITAL_COMMUNITY): Payer: Medicare Other

## 2014-06-24 ENCOUNTER — Encounter (HOSPITAL_COMMUNITY): Payer: Medicare Other

## 2014-06-24 ENCOUNTER — Encounter (HOSPITAL_COMMUNITY)
Admission: RE | Admit: 2014-06-24 | Discharge: 2014-06-24 | Disposition: A | Discharge status: Home / Self Care | Payer: Medicare Other | Source: Ambulatory Visit | Attending: Interventional Cardiology | Admitting: Interventional Cardiology

## 2014-06-24 ENCOUNTER — Telehealth: Payer: Self-pay | Admitting: Interventional Cardiology

## 2014-06-24 NOTE — Telephone Encounter (Signed)
New message      Pt took am and pm beta blocker this am---

## 2014-06-24 NOTE — Progress Notes (Signed)
Debra Thompson reported that she accidentally took her morning and evening dose of her metoprolol this morning. Blood pressure 120/70 sitting. Standing blood pressure 118/80. Heart rate 58.  Patient denies any complaints or symptoms. Dr Thompson Caul office called and notified, spoke with Triage nurse. Patient advised not to exercise today. Debra Thompson was instructed not to take anymore metoprolol today. The patient plans to return to exercise on Friday.

## 2014-06-24 NOTE — Telephone Encounter (Signed)
Spoke with Debra Thompson at cardiac rehab Patient inadvertently took her night pills this am, and when she realized it she then took her morning pills.  This resulted in her getting 2 metoprolol 25 mg, imdur and altace all this morning.  Her BP is 120/68, HR is 58     I spoke with patient.  She has one color pill box for AM and a different color pill box for PM.   She states her BP and HR are about the same as they normally are when she comes to cardiac rehab.  Maria instructed her not to exercise today and to come back on Friday.

## 2014-06-26 ENCOUNTER — Encounter (HOSPITAL_COMMUNITY)
Admission: RE | Admit: 2014-06-26 | Discharge: 2014-06-26 | Disposition: A | Discharge status: Home / Self Care | Payer: Medicare Other | Source: Ambulatory Visit | Attending: Interventional Cardiology | Admitting: Interventional Cardiology

## 2014-06-26 ENCOUNTER — Encounter (HOSPITAL_COMMUNITY): Payer: Medicare Other

## 2014-06-26 DIAGNOSIS — Z5189 Encounter for other specified aftercare: Secondary | ICD-10-CM | POA: Diagnosis not present

## 2014-06-29 ENCOUNTER — Encounter (HOSPITAL_COMMUNITY)
Admission: RE | Admit: 2014-06-29 | Discharge: 2014-06-29 | Disposition: A | Discharge status: Home / Self Care | Payer: Medicare Other | Source: Ambulatory Visit | Attending: Interventional Cardiology | Admitting: Interventional Cardiology

## 2014-06-29 ENCOUNTER — Encounter (HOSPITAL_COMMUNITY): Payer: Medicare Other

## 2014-06-29 DIAGNOSIS — Z5189 Encounter for other specified aftercare: Secondary | ICD-10-CM | POA: Diagnosis not present

## 2014-06-29 NOTE — Progress Notes (Signed)
  Debra Thompson 74 y.o. female Nutrition Note Spoke with pt. Nutrition Plan and Nutrition Survey goals reviewed with pt. Pt is following Step 2 of the Therapeutic Lifestyle Changes diet. Pt wants to lose wt. Pt has been trying to lose wt by decreasing portion sizes and increasing exercise frequency. Wt loss tips reviewed. Pt expressed understanding of the information reviewed. Pt aware of nutrition education classes offered.   Lab Results  Component Value Date   HGBA1C 5.9* 05/01/2014   Nutrition Diagnosis ? Food-and nutrition-related knowledge deficit related to lack of exposure to information as related to diagnosis of: ? CVD ? Pre-DM ? Overweight related to excessive energy intake as evidenced by a BMI of 28.9  Nutrition RX/ Estimated Daily Nutrition Needs for: wt loss  1250-1500 Kcal, 35-40 gm fat, 9-12 gm sat fat, 1.2-1.5 gm trans-fat, <1500 mg sodium  Nutrition Intervention ? Pt's individual nutrition plan reviewed with pt. ? Benefits of adopting Therapeutic Lifestyle Changes discussed when Medficts reviewed. ? Pt to attend the Portion Distortion class ? Pt to attend the   ? Nutrition I class (met; 06/16/14)                    ? Nutrition II class ? Continue client-centered nutrition education by RD, as part of interdisciplinary care.  Goal(s) ? Pt to identify food quantities necessary to achieve: ? wt loss to a goal wt of 144-162 lbs (65.5-73.7 kg) at graduation from cardiac rehab.  ? Pt to describe the benefit of including fruits, vegetables, whole grains, and low-fat dairy products in a heart healthy meal plan.   Monitor and Evaluate progress toward nutrition goal with team. Nutrition Risk: [place], MS Dietetic Intern  Derek Mound, M.Ed, RD, LDN, CDE 06/29/2014 3:36 PM

## 2014-07-01 ENCOUNTER — Encounter (HOSPITAL_COMMUNITY): Payer: Medicare Other

## 2014-07-01 ENCOUNTER — Encounter (HOSPITAL_COMMUNITY)
Admission: RE | Admit: 2014-07-01 | Discharge: 2014-07-01 | Disposition: A | Discharge status: Home / Self Care | Payer: Medicare Other | Source: Ambulatory Visit | Attending: Interventional Cardiology | Admitting: Interventional Cardiology

## 2014-07-01 DIAGNOSIS — Z5189 Encounter for other specified aftercare: Secondary | ICD-10-CM | POA: Diagnosis not present

## 2014-07-03 ENCOUNTER — Encounter (HOSPITAL_COMMUNITY)
Admission: RE | Admit: 2014-07-03 | Discharge: 2014-07-03 | Disposition: A | Discharge status: Home / Self Care | Payer: Medicare Other | Source: Ambulatory Visit | Attending: Interventional Cardiology | Admitting: Interventional Cardiology

## 2014-07-03 ENCOUNTER — Encounter (HOSPITAL_COMMUNITY): Payer: Medicare Other

## 2014-07-03 DIAGNOSIS — Z5189 Encounter for other specified aftercare: Secondary | ICD-10-CM | POA: Diagnosis not present

## 2014-07-06 ENCOUNTER — Encounter (HOSPITAL_COMMUNITY): Payer: Medicare Other

## 2014-07-06 ENCOUNTER — Encounter (HOSPITAL_COMMUNITY)
Admission: RE | Admit: 2014-07-06 | Discharge: 2014-07-06 | Disposition: A | Discharge status: Home / Self Care | Payer: Medicare Other | Source: Ambulatory Visit | Attending: Interventional Cardiology | Admitting: Interventional Cardiology

## 2014-07-06 DIAGNOSIS — I25119 Atherosclerotic heart disease of native coronary artery with unspecified angina pectoris: Secondary | ICD-10-CM | POA: Diagnosis not present

## 2014-07-06 DIAGNOSIS — I252 Old myocardial infarction: Secondary | ICD-10-CM | POA: Insufficient documentation

## 2014-07-06 DIAGNOSIS — E780 Pure hypercholesterolemia: Secondary | ICD-10-CM | POA: Insufficient documentation

## 2014-07-06 DIAGNOSIS — Z5189 Encounter for other specified aftercare: Secondary | ICD-10-CM | POA: Insufficient documentation

## 2014-07-06 DIAGNOSIS — Z8249 Family history of ischemic heart disease and other diseases of the circulatory system: Secondary | ICD-10-CM | POA: Diagnosis not present

## 2014-07-06 DIAGNOSIS — I10 Essential (primary) hypertension: Secondary | ICD-10-CM | POA: Insufficient documentation

## 2014-07-06 DIAGNOSIS — Z79899 Other long term (current) drug therapy: Secondary | ICD-10-CM | POA: Insufficient documentation

## 2014-07-06 DIAGNOSIS — E785 Hyperlipidemia, unspecified: Secondary | ICD-10-CM | POA: Insufficient documentation

## 2014-07-06 DIAGNOSIS — Z87891 Personal history of nicotine dependence: Secondary | ICD-10-CM | POA: Insufficient documentation

## 2014-07-06 DIAGNOSIS — Z823 Family history of stroke: Secondary | ICD-10-CM | POA: Diagnosis not present

## 2014-07-06 DIAGNOSIS — Z91041 Radiographic dye allergy status: Secondary | ICD-10-CM | POA: Diagnosis not present

## 2014-07-06 DIAGNOSIS — M1990 Unspecified osteoarthritis, unspecified site: Secondary | ICD-10-CM | POA: Diagnosis not present

## 2014-07-08 ENCOUNTER — Encounter (HOSPITAL_COMMUNITY): Payer: Medicare Other

## 2014-07-08 ENCOUNTER — Encounter (HOSPITAL_COMMUNITY)
Admission: RE | Admit: 2014-07-08 | Discharge: 2014-07-08 | Disposition: A | Discharge status: Home / Self Care | Payer: Medicare Other | Source: Ambulatory Visit | Attending: Interventional Cardiology | Admitting: Interventional Cardiology

## 2014-07-08 DIAGNOSIS — Z5189 Encounter for other specified aftercare: Secondary | ICD-10-CM | POA: Diagnosis not present

## 2014-07-10 ENCOUNTER — Encounter (HOSPITAL_COMMUNITY): Payer: Medicare Other

## 2014-07-10 ENCOUNTER — Encounter (HOSPITAL_COMMUNITY)
Admission: RE | Admit: 2014-07-10 | Discharge: 2014-07-10 | Disposition: A | Discharge status: Home / Self Care | Payer: Medicare Other | Source: Ambulatory Visit | Attending: Interventional Cardiology | Admitting: Interventional Cardiology

## 2014-07-10 DIAGNOSIS — Z5189 Encounter for other specified aftercare: Secondary | ICD-10-CM | POA: Diagnosis not present

## 2014-07-13 ENCOUNTER — Encounter (HOSPITAL_COMMUNITY)
Admission: RE | Admit: 2014-07-13 | Discharge: 2014-07-13 | Disposition: A | Discharge status: Home / Self Care | Payer: Medicare Other | Source: Ambulatory Visit | Attending: Interventional Cardiology | Admitting: Interventional Cardiology

## 2014-07-13 ENCOUNTER — Encounter (HOSPITAL_COMMUNITY): Payer: Medicare Other

## 2014-07-13 DIAGNOSIS — Z5189 Encounter for other specified aftercare: Secondary | ICD-10-CM | POA: Diagnosis not present

## 2014-07-15 ENCOUNTER — Encounter (HOSPITAL_COMMUNITY)
Admission: RE | Admit: 2014-07-15 | Discharge: 2014-07-15 | Disposition: A | Discharge status: Home / Self Care | Payer: Medicare Other | Source: Ambulatory Visit | Attending: Interventional Cardiology | Admitting: Interventional Cardiology

## 2014-07-15 ENCOUNTER — Encounter (HOSPITAL_COMMUNITY): Payer: Medicare Other

## 2014-07-15 DIAGNOSIS — Z5189 Encounter for other specified aftercare: Secondary | ICD-10-CM | POA: Diagnosis not present

## 2014-07-17 ENCOUNTER — Encounter (HOSPITAL_COMMUNITY): Payer: Medicare Other

## 2014-07-17 ENCOUNTER — Encounter (HOSPITAL_COMMUNITY)
Admission: RE | Admit: 2014-07-17 | Discharge: 2014-07-17 | Disposition: A | Discharge status: Home / Self Care | Payer: Medicare Other | Source: Ambulatory Visit | Attending: Interventional Cardiology | Admitting: Interventional Cardiology

## 2014-07-17 DIAGNOSIS — Z5189 Encounter for other specified aftercare: Secondary | ICD-10-CM | POA: Diagnosis not present

## 2014-07-20 ENCOUNTER — Encounter (HOSPITAL_COMMUNITY): Payer: Medicare Other

## 2014-07-20 ENCOUNTER — Encounter (HOSPITAL_COMMUNITY)
Admission: RE | Admit: 2014-07-20 | Discharge: 2014-07-20 | Disposition: A | Discharge status: Home / Self Care | Payer: Medicare Other | Source: Ambulatory Visit | Attending: Interventional Cardiology | Admitting: Interventional Cardiology

## 2014-07-20 DIAGNOSIS — Z5189 Encounter for other specified aftercare: Secondary | ICD-10-CM | POA: Diagnosis not present

## 2014-07-22 ENCOUNTER — Encounter (HOSPITAL_COMMUNITY): Payer: Medicare Other

## 2014-07-22 ENCOUNTER — Encounter (HOSPITAL_COMMUNITY)
Admission: RE | Admit: 2014-07-22 | Discharge: 2014-07-22 | Disposition: A | Discharge status: Home / Self Care | Payer: Medicare Other | Source: Ambulatory Visit | Attending: Interventional Cardiology | Admitting: Interventional Cardiology

## 2014-07-22 DIAGNOSIS — Z5189 Encounter for other specified aftercare: Secondary | ICD-10-CM | POA: Diagnosis not present

## 2014-07-24 ENCOUNTER — Telehealth (HOSPITAL_COMMUNITY): Payer: Self-pay | Admitting: *Deleted

## 2014-07-24 ENCOUNTER — Encounter (HOSPITAL_COMMUNITY)
Admission: RE | Admit: 2014-07-24 | Discharge: 2014-07-24 | Disposition: A | Discharge status: Home / Self Care | Payer: Medicare Other | Source: Ambulatory Visit | Attending: Interventional Cardiology | Admitting: Interventional Cardiology

## 2014-07-24 ENCOUNTER — Encounter (HOSPITAL_COMMUNITY): Payer: Medicare Other

## 2014-07-24 DIAGNOSIS — Z5189 Encounter for other specified aftercare: Secondary | ICD-10-CM | POA: Diagnosis not present
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
## 2014-07-27 ENCOUNTER — Encounter (HOSPITAL_COMMUNITY): Payer: Medicare Other

## 2014-07-27 ENCOUNTER — Encounter (HOSPITAL_COMMUNITY)
Admission: RE | Admit: 2014-07-27 | Discharge: 2014-07-27 | Disposition: A | Discharge status: Home / Self Care | Payer: Medicare Other | Source: Ambulatory Visit | Attending: Interventional Cardiology | Admitting: Interventional Cardiology

## 2014-07-27 DIAGNOSIS — Z5189 Encounter for other specified aftercare: Secondary | ICD-10-CM | POA: Diagnosis not present

## 2014-07-28 ENCOUNTER — Encounter: Payer: Self-pay | Admitting: Interventional Cardiology

## 2014-07-28 ENCOUNTER — Ambulatory Visit (INDEPENDENT_AMBULATORY_CARE_PROVIDER_SITE_OTHER): Payer: Medicare Other | Admitting: Interventional Cardiology

## 2014-07-28 VITALS — BP 126/60 | HR 63

## 2014-07-28 DIAGNOSIS — I10 Essential (primary) hypertension: Secondary | ICD-10-CM

## 2014-07-28 DIAGNOSIS — I5032 Chronic diastolic (congestive) heart failure: Secondary | ICD-10-CM | POA: Diagnosis not present

## 2014-07-28 DIAGNOSIS — I2510 Atherosclerotic heart disease of native coronary artery without angina pectoris: Secondary | ICD-10-CM

## 2014-07-28 NOTE — Patient Instructions (Signed)
Medication Instructions:  Your physician recommends that you continue on your current medications as directed. Please refer to the Current Medication list given to you today.   Labwork: Please supply [place] a copy of your recent labs ordered by Dr.Tisovec  Testing/Procedures: None   Follow-Up: Your physician recommends that you schedule a follow-up appointment in: 3 months  Any Other Special Instructions Will Be Listed Below (If Applicable).
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
## 2014-07-29 ENCOUNTER — Encounter (HOSPITAL_COMMUNITY)
Admission: RE | Admit: 2014-07-29 | Discharge: 2014-07-29 | Disposition: A | Discharge status: Home / Self Care | Payer: Medicare Other | Source: Ambulatory Visit | Attending: Interventional Cardiology | Admitting: Interventional Cardiology

## 2014-07-29 ENCOUNTER — Encounter (HOSPITAL_COMMUNITY): Payer: Medicare Other

## 2014-07-29 DIAGNOSIS — Z5189 Encounter for other specified aftercare: Secondary | ICD-10-CM | POA: Diagnosis not present

## 2014-07-31 ENCOUNTER — Encounter (HOSPITAL_COMMUNITY): Payer: Medicare Other

## 2014-07-31 ENCOUNTER — Encounter (HOSPITAL_COMMUNITY)
Admission: RE | Admit: 2014-07-31 | Discharge: 2014-07-31 | Disposition: A | Discharge status: Home / Self Care | Payer: Medicare Other | Source: Ambulatory Visit | Attending: Interventional Cardiology | Admitting: Interventional Cardiology

## 2014-07-31 DIAGNOSIS — Z5189 Encounter for other specified aftercare: Secondary | ICD-10-CM | POA: Diagnosis not present

## 2014-08-03 ENCOUNTER — Encounter (HOSPITAL_COMMUNITY): Payer: Medicare Other

## 2014-08-05 ENCOUNTER — Encounter (HOSPITAL_COMMUNITY)
Admission: RE | Admit: 2014-08-05 | Discharge: 2014-08-05 | Disposition: A | Discharge status: Home / Self Care | Payer: Medicare Other | Source: Ambulatory Visit | Attending: Interventional Cardiology | Admitting: Interventional Cardiology

## 2014-08-05 ENCOUNTER — Encounter (HOSPITAL_COMMUNITY): Payer: Medicare Other

## 2014-08-05 DIAGNOSIS — M1990 Unspecified osteoarthritis, unspecified site: Secondary | ICD-10-CM | POA: Diagnosis not present

## 2014-08-05 DIAGNOSIS — Z91041 Radiographic dye allergy status: Secondary | ICD-10-CM | POA: Diagnosis not present

## 2014-08-05 DIAGNOSIS — E785 Hyperlipidemia, unspecified: Secondary | ICD-10-CM | POA: Insufficient documentation

## 2014-08-05 DIAGNOSIS — I25119 Atherosclerotic heart disease of native coronary artery with unspecified angina pectoris: Secondary | ICD-10-CM | POA: Diagnosis not present

## 2014-08-05 DIAGNOSIS — Z5189 Encounter for other specified aftercare: Secondary | ICD-10-CM | POA: Diagnosis not present

## 2014-08-05 DIAGNOSIS — I252 Old myocardial infarction: Secondary | ICD-10-CM | POA: Insufficient documentation

## 2014-08-05 DIAGNOSIS — Z823 Family history of stroke: Secondary | ICD-10-CM | POA: Insufficient documentation

## 2014-08-05 DIAGNOSIS — E780 Pure hypercholesterolemia: Secondary | ICD-10-CM | POA: Diagnosis not present

## 2014-08-05 DIAGNOSIS — Z87891 Personal history of nicotine dependence: Secondary | ICD-10-CM | POA: Insufficient documentation

## 2014-08-05 DIAGNOSIS — Z8249 Family history of ischemic heart disease and other diseases of the circulatory system: Secondary | ICD-10-CM | POA: Diagnosis not present

## 2014-08-05 DIAGNOSIS — I10 Essential (primary) hypertension: Secondary | ICD-10-CM | POA: Insufficient documentation

## 2014-08-05 DIAGNOSIS — Z79899 Other long term (current) drug therapy: Secondary | ICD-10-CM | POA: Insufficient documentation

## 2014-08-07 ENCOUNTER — Encounter (HOSPITAL_COMMUNITY)
Admission: RE | Admit: 2014-08-07 | Discharge: 2014-08-07 | Disposition: A | Discharge status: Home / Self Care | Payer: Medicare Other | Source: Ambulatory Visit | Attending: Interventional Cardiology | Admitting: Interventional Cardiology

## 2014-08-07 ENCOUNTER — Encounter (HOSPITAL_COMMUNITY): Payer: Medicare Other

## 2014-08-07 DIAGNOSIS — Z5189 Encounter for other specified aftercare: Secondary | ICD-10-CM | POA: Diagnosis not present

## 2014-08-10 ENCOUNTER — Encounter (HOSPITAL_COMMUNITY): Payer: Medicare Other

## 2014-08-10 ENCOUNTER — Encounter (HOSPITAL_COMMUNITY)
Admission: RE | Admit: 2014-08-10 | Discharge: 2014-08-10 | Disposition: A | Discharge status: Home / Self Care | Payer: Medicare Other | Source: Ambulatory Visit | Attending: Interventional Cardiology | Admitting: Interventional Cardiology

## 2014-08-10 DIAGNOSIS — Z5189 Encounter for other specified aftercare: Secondary | ICD-10-CM | POA: Diagnosis not present

## 2014-08-12 ENCOUNTER — Encounter (HOSPITAL_COMMUNITY): Payer: Medicare Other

## 2014-08-12 ENCOUNTER — Encounter (HOSPITAL_COMMUNITY)
Admission: RE | Admit: 2014-08-12 | Discharge: 2014-08-12 | Disposition: A | Discharge status: Home / Self Care | Payer: Medicare Other | Source: Ambulatory Visit | Attending: Interventional Cardiology | Admitting: Interventional Cardiology

## 2014-08-12 DIAGNOSIS — Z5189 Encounter for other specified aftercare: Secondary | ICD-10-CM | POA: Diagnosis not present

## 2014-08-14 ENCOUNTER — Encounter (HOSPITAL_COMMUNITY)
Admission: RE | Admit: 2014-08-14 | Discharge: 2014-08-14 | Disposition: A | Discharge status: Home / Self Care | Payer: Medicare Other | Source: Ambulatory Visit | Attending: Interventional Cardiology | Admitting: Interventional Cardiology

## 2014-08-14 ENCOUNTER — Encounter (HOSPITAL_COMMUNITY): Payer: Medicare Other

## 2014-08-14 DIAGNOSIS — Z5189 Encounter for other specified aftercare: Secondary | ICD-10-CM | POA: Diagnosis not present

## 2014-08-14 NOTE — Progress Notes (Signed)
Pt graduated from cardiac rehab program on monday with completion of 36 exercise sessions in Phase II. Pt maintained good attendance and progressed nicely during his participation in rehab as evidenced by increased MET level.   Medication list reconciled. Repeat  PHQ score-0  .  Pt has made significant lifestyle changes and should be commended for her success. Pt feels she has achieved his goals during cardiac rehab.   Pt plans to continue exercise via walking. Marguriete says she is walking 2 miles twice a day and has a lot more energy and endurance. We are proud of Basma's efforts!

## 2014-08-17 ENCOUNTER — Encounter (HOSPITAL_COMMUNITY): Payer: Medicare Other

## 2014-08-17 ENCOUNTER — Encounter (HOSPITAL_COMMUNITY)
Admission: RE | Admit: 2014-08-17 | Discharge: 2014-08-17 | Disposition: A | Discharge status: Home / Self Care | Payer: Medicare Other | Source: Ambulatory Visit | Attending: Interventional Cardiology | Admitting: Interventional Cardiology

## 2014-08-17 DIAGNOSIS — Z5189 Encounter for other specified aftercare: Secondary | ICD-10-CM | POA: Diagnosis not present

## 2014-08-19 ENCOUNTER — Encounter (HOSPITAL_COMMUNITY): Payer: Medicare Other

## 2014-08-21 ENCOUNTER — Encounter (HOSPITAL_COMMUNITY): Payer: Medicare Other

## 2014-08-24 ENCOUNTER — Encounter (HOSPITAL_COMMUNITY): Payer: Medicare Other

## 2014-08-26 ENCOUNTER — Encounter (HOSPITAL_COMMUNITY): Payer: Medicare Other

## 2014-08-28 ENCOUNTER — Encounter (HOSPITAL_COMMUNITY): Payer: Medicare Other

## 2014-08-31 ENCOUNTER — Encounter (HOSPITAL_COMMUNITY): Payer: Medicare Other

## 2014-09-02 ENCOUNTER — Encounter (HOSPITAL_COMMUNITY): Payer: Medicare Other

## 2014-09-04 ENCOUNTER — Encounter (HOSPITAL_COMMUNITY): Payer: Medicare Other

## 2014-09-09 ENCOUNTER — Encounter (HOSPITAL_COMMUNITY): Payer: Medicare Other

## 2014-09-11 ENCOUNTER — Encounter (HOSPITAL_COMMUNITY): Payer: Medicare Other

## 2014-09-14 ENCOUNTER — Encounter (HOSPITAL_COMMUNITY): Payer: Medicare Other

## 2014-09-16 ENCOUNTER — Encounter (HOSPITAL_COMMUNITY): Payer: Medicare Other

## 2014-09-18 ENCOUNTER — Encounter (HOSPITAL_COMMUNITY): Payer: Medicare Other
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
## 2014-11-10 ENCOUNTER — Encounter: Payer: Self-pay | Admitting: Interventional Cardiology

## 2014-11-10 ENCOUNTER — Ambulatory Visit (INDEPENDENT_AMBULATORY_CARE_PROVIDER_SITE_OTHER): Payer: Medicare Other | Admitting: Interventional Cardiology

## 2014-11-10 VITALS — BP 134/66 | HR 69 | Ht 65.0 in | Wt 171.0 lb

## 2014-11-10 DIAGNOSIS — I119 Hypertensive heart disease without heart failure: Secondary | ICD-10-CM

## 2014-11-10 DIAGNOSIS — I25810 Atherosclerosis of coronary artery bypass graft(s) without angina pectoris: Secondary | ICD-10-CM | POA: Diagnosis not present

## 2014-11-10 DIAGNOSIS — I10 Essential (primary) hypertension: Secondary | ICD-10-CM

## 2014-11-10 DIAGNOSIS — I5032 Chronic diastolic (congestive) heart failure: Secondary | ICD-10-CM | POA: Diagnosis not present

## 2014-11-10 NOTE — Patient Instructions (Signed)
Medication Instructions:  Your physician has recommended you make the following change in your medication:  REDUCE Imdur to 60mg  daily for 2 weeks. If no angina REDUCE Imdur to 30mg  daily.  Labwork: None ordered  Testing/Procedures: None ordered  Follow-Up: Your physician wants you to follow-up in: 6 months with Dr.Smith You will receive a reminder letter in the mail two months in advance. If you don't receive a letter, please call our office to schedule the follow-up appointment.   Any Other Special Instructions Will Be Listed Below (If Applicable).

## 2015-03-31 ENCOUNTER — Other Ambulatory Visit: Payer: Self-pay | Admitting: Interventional Cardiology

## 2015-03-31 ENCOUNTER — Other Ambulatory Visit: Payer: Self-pay | Admitting: Cardiology

## 2015-04-18 ENCOUNTER — Other Ambulatory Visit: Payer: Self-pay | Admitting: Cardiology

## 2015-05-13 ENCOUNTER — Encounter: Payer: Self-pay | Admitting: Interventional Cardiology

## 2015-05-13 ENCOUNTER — Ambulatory Visit (INDEPENDENT_AMBULATORY_CARE_PROVIDER_SITE_OTHER): Payer: PPO | Admitting: Interventional Cardiology

## 2015-05-13 VITALS — BP 130/74 | HR 58 | Ht 65.0 in | Wt 176.8 lb

## 2015-05-13 DIAGNOSIS — I5032 Chronic diastolic (congestive) heart failure: Secondary | ICD-10-CM | POA: Diagnosis not present

## 2015-05-13 DIAGNOSIS — I214 Non-ST elevation (NSTEMI) myocardial infarction: Secondary | ICD-10-CM

## 2015-05-13 DIAGNOSIS — I10 Essential (primary) hypertension: Secondary | ICD-10-CM | POA: Diagnosis not present

## 2015-05-13 DIAGNOSIS — I25810 Atherosclerosis of coronary artery bypass graft(s) without angina pectoris: Secondary | ICD-10-CM

## 2015-05-13 DIAGNOSIS — R002 Palpitations: Secondary | ICD-10-CM

## 2015-05-13 NOTE — Patient Instructions (Signed)
Medication Instructions:  Your physician has recommended you make the following change in your medication:  STOP Plavix  Labwork: None ordered  Testing/Procedures: Your physician has recommended that you wear a holter monitor. Holter monitors are medical devices that record the heart's electrical activity. Doctors most often use these monitors to diagnose arrhythmias. Arrhythmias are problems with the speed or rhythm of the heartbeat. The monitor is a small, portable device. You can wear one while you do your normal daily activities. This is usually used to diagnose what is causing palpitations/syncope (passing out).  Your physician has requested that you have a lexiscan myoview. For further information please visit [employer]uge[employer]iesta[employer]tn. Please follow instruction sheet, as given.    Follow-Up: Your physician wants you to follow-up in: 1 year  You will receive a reminder letter in the mail two months in advance. If you don't receive a letter, please call our office to schedule the follow-up appointment.   Any Other Special Instructions Will Be Listed Below (If Applicable).     If you need a refill on your cardiac medications before your next appointment, please call your pharmacy.
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
## 2015-05-18 ENCOUNTER — Telehealth (HOSPITAL_COMMUNITY): Payer: Self-pay | Admitting: *Deleted

## 2015-05-18 NOTE — Telephone Encounter (Signed)
Patient given detailed instructions per Myocardial Perfusion Study Information Sheet for the test on 05/20/15 at 0745. Patient notified to arrive 15 minutes early and that it is imperative to arrive on time for appointment to keep from having the test rescheduled.  If you need to cancel or reschedule your appointment, please call the office within 24 hours of your appointment. Failure to do so may result in a cancellation of your appointment, and a $50 no show fee. Patient verbalized understanding.Ileane Sando, Ranae Palms

## 2015-05-20 ENCOUNTER — Ambulatory Visit (HOSPITAL_COMMUNITY): Payer: PPO | Attending: Interventional Cardiology

## 2015-05-20 ENCOUNTER — Ambulatory Visit (INDEPENDENT_AMBULATORY_CARE_PROVIDER_SITE_OTHER): Payer: PPO

## 2015-05-20 DIAGNOSIS — I25810 Atherosclerosis of coronary artery bypass graft(s) without angina pectoris: Secondary | ICD-10-CM | POA: Diagnosis not present

## 2015-05-20 DIAGNOSIS — I10 Essential (primary) hypertension: Secondary | ICD-10-CM | POA: Insufficient documentation

## 2015-05-20 DIAGNOSIS — R002 Palpitations: Secondary | ICD-10-CM

## 2015-05-20 DIAGNOSIS — R9439 Abnormal result of other cardiovascular function study: Secondary | ICD-10-CM | POA: Insufficient documentation

## 2015-05-20 LAB — MYOCARDIAL PERFUSION IMAGING
CHL CUP NUCLEAR SDS: 4
CHL CUP RESTING HR STRESS: 62 {beats}/min
LHR: 0.23
LV dias vol: 74 mL (ref 46–106)
LV sys vol: 14 mL
Peak HR: 79 {beats}/min
SRS: 6
SSS: 10
TID: 1.06

## 2015-05-20 MED ORDER — TECHNETIUM TC 99M SESTAMIBI GENERIC - CARDIOLITE
32.2000 | Freq: Once | INTRAVENOUS | Status: AC | PRN
  Administered 2015-05-20: 32.2 via INTRAVENOUS

## 2015-05-20 MED ORDER — REGADENOSON 0.4 MG/5ML IV SOLN
0.4000 mg | Freq: Once | INTRAVENOUS | Status: AC
  Administered 2015-05-20: 0.4 mg via INTRAVENOUS

## 2015-05-20 MED ORDER — TECHNETIUM TC 99M SESTAMIBI GENERIC - CARDIOLITE
10.4000 | Freq: Once | INTRAVENOUS | Status: AC | PRN
  Administered 2015-05-20: 10 via INTRAVENOUS

## 2015-05-26 ENCOUNTER — Telehealth: Payer: Self-pay | Admitting: Interventional Cardiology

## 2015-05-26 NOTE — Telephone Encounter (Signed)
Pt aware of myoview results with verbal understanding. 

## 2015-05-26 NOTE — Telephone Encounter (Signed)
-----   Message from Belva Crome, MD sent at 05/22/2015 11:26 AM EDT ----- No function of heart. Small scar from prior event. Very reassurring

## 2015-05-26 NOTE — Telephone Encounter (Signed)
F/u  Pt calling to speak w/ rN concerning myoview and holter results. Please call back and discuss.

## 2015-05-27 ENCOUNTER — Telehealth: Payer: Self-pay

## 2015-05-27 NOTE — Telephone Encounter (Signed)
Pt aware of Dr.Smith's response Ok to d/c Imdur as discussed at her last o/v. Pt med list update. Pt voiced appreciation and verbalized understanding.

## 2015-05-27 NOTE — Telephone Encounter (Signed)
-----   Message from Belva Crome, MD sent at 05/27/2015  7:43 AM EDT ----- Yes. I forgot that we discussed that. ----- Message -----    From: Lamar Laundry, CMA    Sent: 05/26/2015   2:57 PM      To: Belva Crome, MD  Pt aware of myoview results with verbal understanding. Pt would like to know if she can d/c Imdur as discussed at her last o/v. Adv her I will fwd the message to Dr.Smith

## 2015-07-29 DIAGNOSIS — I10 Essential (primary) hypertension: Secondary | ICD-10-CM | POA: Diagnosis not present

## 2015-07-29 DIAGNOSIS — M859 Disorder of bone density and structure, unspecified: Secondary | ICD-10-CM | POA: Diagnosis not present

## 2015-07-29 DIAGNOSIS — R7302 Impaired glucose tolerance (oral): Secondary | ICD-10-CM | POA: Diagnosis not present

## 2015-07-29 DIAGNOSIS — R8299 Other abnormal findings in urine: Secondary | ICD-10-CM | POA: Diagnosis not present

## 2015-07-29 DIAGNOSIS — N390 Urinary tract infection, site not specified: Secondary | ICD-10-CM | POA: Diagnosis not present

## 2015-08-05 DIAGNOSIS — I25810 Atherosclerosis of coronary artery bypass graft(s) without angina pectoris: Secondary | ICD-10-CM | POA: Diagnosis not present

## 2015-08-05 DIAGNOSIS — Z1389 Encounter for screening for other disorder: Secondary | ICD-10-CM | POA: Diagnosis not present

## 2015-08-05 DIAGNOSIS — R809 Proteinuria, unspecified: Secondary | ICD-10-CM | POA: Diagnosis not present

## 2015-08-05 DIAGNOSIS — D692 Other nonthrombocytopenic purpura: Secondary | ICD-10-CM | POA: Diagnosis not present

## 2015-08-05 DIAGNOSIS — N182 Chronic kidney disease, stage 2 (mild): Secondary | ICD-10-CM | POA: Diagnosis not present

## 2015-08-05 DIAGNOSIS — I214 Non-ST elevation (NSTEMI) myocardial infarction: Secondary | ICD-10-CM | POA: Diagnosis not present

## 2015-08-05 DIAGNOSIS — E663 Overweight: Secondary | ICD-10-CM | POA: Diagnosis not present

## 2015-08-05 DIAGNOSIS — I209 Angina pectoris, unspecified: Secondary | ICD-10-CM | POA: Diagnosis not present

## 2015-08-05 DIAGNOSIS — I5032 Chronic diastolic (congestive) heart failure: Secondary | ICD-10-CM | POA: Diagnosis not present

## 2015-08-05 DIAGNOSIS — Z0000 Encounter for general adult medical examination without abnormal findings: Secondary | ICD-10-CM | POA: Diagnosis not present

## 2015-08-05 DIAGNOSIS — S069X0S Unspecified intracranial injury without loss of consciousness, sequela: Secondary | ICD-10-CM | POA: Diagnosis not present

## 2015-08-05 DIAGNOSIS — I1310 Hypertensive heart and chronic kidney disease without heart failure, with stage 1 through stage 4 chronic kidney disease, or unspecified chronic kidney disease: Secondary | ICD-10-CM | POA: Diagnosis not present

## 2015-08-06 ENCOUNTER — Other Ambulatory Visit: Payer: Self-pay | Admitting: Internal Medicine

## 2015-08-06 DIAGNOSIS — Z1231 Encounter for screening mammogram for malignant neoplasm of breast: Secondary | ICD-10-CM

## 2015-08-09 DIAGNOSIS — Z1231 Encounter for screening mammogram for malignant neoplasm of breast: Secondary | ICD-10-CM | POA: Diagnosis not present

## 2015-10-14 ENCOUNTER — Other Ambulatory Visit: Payer: Self-pay | Admitting: Interventional Cardiology

## 2015-11-16 DIAGNOSIS — L853 Xerosis cutis: Secondary | ICD-10-CM | POA: Diagnosis not present

## 2015-11-16 DIAGNOSIS — L814 Other melanin hyperpigmentation: Secondary | ICD-10-CM | POA: Diagnosis not present

## 2015-11-16 DIAGNOSIS — L918 Other hypertrophic disorders of the skin: Secondary | ICD-10-CM | POA: Diagnosis not present

## 2015-11-16 DIAGNOSIS — L821 Other seborrheic keratosis: Secondary | ICD-10-CM | POA: Diagnosis not present

## 2015-11-16 DIAGNOSIS — D1801 Hemangioma of skin and subcutaneous tissue: Secondary | ICD-10-CM | POA: Diagnosis not present

## 2015-11-20 DIAGNOSIS — Z23 Encounter for immunization: Secondary | ICD-10-CM | POA: Diagnosis not present

## 2016-04-22 ENCOUNTER — Other Ambulatory Visit: Payer: Self-pay | Admitting: Interventional Cardiology

## 2016-04-26 ENCOUNTER — Telehealth: Payer: Self-pay | Admitting: Internal Medicine

## 2016-04-26 NOTE — Telephone Encounter (Signed)
Patient was having yawning in the evening and she took 1 tab of NTG which improved it. She did not have any more symptoms since then. She is calling to check in if she needs to take 2 more of NTG or not. She was reassured. She was also told that if she has more symptoms then she needs to call 911 and come to ED.

## 2016-04-28 ENCOUNTER — Telehealth: Payer: Self-pay | Admitting: Interventional Cardiology

## 2016-04-28 NOTE — Telephone Encounter (Signed)
Left message to call back  

## 2016-04-28 NOTE — Telephone Encounter (Signed)
See other phone encounter.  

## 2016-04-28 NOTE — Telephone Encounter (Signed)
Spoke with pt and she states that she had a small episode of SOB.  Felt slight cramping in her ribs.  Feels this was all some trapped gas.  No issues since this episode.  Denies CP.  Went over proper use of Nitro and when to proceed to ER.  Pt verbalized understanding and was appreciative for call. Advised to call if any other issues.

## 2016-04-28 NOTE — Telephone Encounter (Signed)
Verify that the patient mentioned using nitroglycerin for yawning. This is not a reason to use nitroglycerin, if this note is

## 2016-04-28 NOTE — Telephone Encounter (Signed)
New message    Pt is calling to return Jennifer's call.

## 2016-05-19 ENCOUNTER — Ambulatory Visit (INDEPENDENT_AMBULATORY_CARE_PROVIDER_SITE_OTHER): Payer: PPO | Admitting: Interventional Cardiology

## 2016-05-19 ENCOUNTER — Encounter (INDEPENDENT_AMBULATORY_CARE_PROVIDER_SITE_OTHER): Payer: Self-pay

## 2016-05-19 ENCOUNTER — Encounter: Payer: Self-pay | Admitting: Interventional Cardiology

## 2016-05-19 VITALS — BP 132/80 | HR 65 | Ht 65.0 in | Wt 177.0 lb

## 2016-05-19 DIAGNOSIS — I110 Hypertensive heart disease with heart failure: Secondary | ICD-10-CM | POA: Diagnosis not present

## 2016-05-19 DIAGNOSIS — I5032 Chronic diastolic (congestive) heart failure: Secondary | ICD-10-CM | POA: Diagnosis not present

## 2016-05-19 DIAGNOSIS — E7800 Pure hypercholesterolemia, unspecified: Secondary | ICD-10-CM | POA: Diagnosis not present

## 2016-05-19 DIAGNOSIS — I25118 Atherosclerotic heart disease of native coronary artery with other forms of angina pectoris: Secondary | ICD-10-CM
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
## 2016-05-19 NOTE — Patient Instructions (Signed)
Medication Instructions:  None  Labwork: None  Testing/Procedures: None  Follow-Up: Your physician wants you to follow-up in: 1 year with Dr. Tamala Julian.  You will receive a reminder letter in the mail two months in advance. If you don't receive a letter, please call our office to schedule the follow-up appointment.   Any Other Special Instructions Will Be Listed Below (If Applicable).  Dr. Tamala Julian would like for you to restart a regular exercise routine.  Call us if exercise causes any kind of chest pain or discomfort.    If you need a refill on your cardiac medications before your next appointment, please call your pharmacy.

## 2016-07-20 ENCOUNTER — Other Ambulatory Visit: Payer: Self-pay | Admitting: Interventional Cardiology

## 2016-08-03 DIAGNOSIS — R7302 Impaired glucose tolerance (oral): Secondary | ICD-10-CM | POA: Diagnosis not present

## 2016-08-03 DIAGNOSIS — R8299 Other abnormal findings in urine: Secondary | ICD-10-CM | POA: Diagnosis not present

## 2016-08-03 DIAGNOSIS — N182 Chronic kidney disease, stage 2 (mild): Secondary | ICD-10-CM | POA: Diagnosis not present

## 2016-08-03 DIAGNOSIS — N390 Urinary tract infection, site not specified: Secondary | ICD-10-CM | POA: Diagnosis not present

## 2016-08-03 DIAGNOSIS — E7800 Pure hypercholesterolemia, unspecified: Secondary | ICD-10-CM | POA: Diagnosis not present

## 2016-08-03 DIAGNOSIS — M859 Disorder of bone density and structure, unspecified: Secondary | ICD-10-CM | POA: Diagnosis not present

## 2016-08-10 DIAGNOSIS — I25810 Atherosclerosis of coronary artery bypass graft(s) without angina pectoris: Secondary | ICD-10-CM | POA: Diagnosis not present

## 2016-08-10 DIAGNOSIS — R808 Other proteinuria: Secondary | ICD-10-CM | POA: Diagnosis not present

## 2016-08-10 DIAGNOSIS — I208 Other forms of angina pectoris: Secondary | ICD-10-CM | POA: Diagnosis not present

## 2016-08-10 DIAGNOSIS — M859 Disorder of bone density and structure, unspecified: Secondary | ICD-10-CM | POA: Diagnosis not present

## 2016-08-10 DIAGNOSIS — I1310 Hypertensive heart and chronic kidney disease without heart failure, with stage 1 through stage 4 chronic kidney disease, or unspecified chronic kidney disease: Secondary | ICD-10-CM | POA: Diagnosis not present

## 2016-08-10 DIAGNOSIS — I5032 Chronic diastolic (congestive) heart failure: Secondary | ICD-10-CM | POA: Diagnosis not present

## 2016-08-10 DIAGNOSIS — E7800 Pure hypercholesterolemia, unspecified: Secondary | ICD-10-CM | POA: Diagnosis not present

## 2016-08-10 DIAGNOSIS — Z6830 Body mass index (BMI) 30.0-30.9, adult: Secondary | ICD-10-CM | POA: Diagnosis not present

## 2016-08-10 DIAGNOSIS — N182 Chronic kidney disease, stage 2 (mild): Secondary | ICD-10-CM | POA: Diagnosis not present

## 2016-08-10 DIAGNOSIS — R7302 Impaired glucose tolerance (oral): Secondary | ICD-10-CM | POA: Diagnosis not present

## 2016-08-10 DIAGNOSIS — Z0000 Encounter for general adult medical examination without abnormal findings: Secondary | ICD-10-CM | POA: Diagnosis not present

## 2016-08-10 DIAGNOSIS — Z1389 Encounter for screening for other disorder: Secondary | ICD-10-CM | POA: Diagnosis not present

## 2016-09-18 ENCOUNTER — Other Ambulatory Visit: Payer: Self-pay | Admitting: Interventional Cardiology

## 2016-09-30 IMAGING — NM NM MISC PROCEDURE
3 series · 18 of 18 positions shown · non-contrast
Comparison: none

[Series 1: wbr_r-proj_st rest_(id)_sa · 6.5mm · 6.51mm/px · 6 of 64 frames shown]
[frame 6/64]
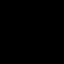
[frame 16/64]
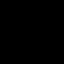
[frame 27/64]
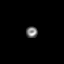
[frame 38/64]
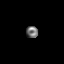
[frame 48/64]
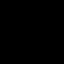
[frame 59/64]
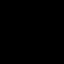

[Series 1: wbr_s-proj_st stress_(id)_sa · 6.5mm · 6.51mm/px · 6 of 512 frames shown (1 of 2)]
[frame 43/512]
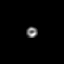
[frame 128/512]
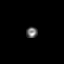
[frame 214/512]
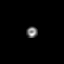
[frame 299/512]
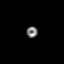
[frame 384/512]
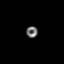
[frame 470/512]
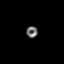

[Series 1: wbr_s-proj_st stress_(id)_sa · 6.5mm · 6.51mm/px · 6 of 64 frames shown (2 of 2)]
[frame 6/64]
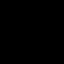
[frame 16/64]
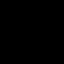
[frame 27/64]
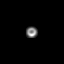
[frame 38/64]
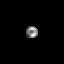
[frame 48/64]
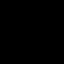
[frame 59/64]
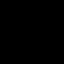

[18 of 18 positions shown; findings below may reference images not displayed]

Canned report from images found in remote index.

Refer to host system for actual result text.

## 2016-10-16 ENCOUNTER — Other Ambulatory Visit: Payer: Self-pay | Admitting: Interventional Cardiology

## 2016-11-16 DIAGNOSIS — L308 Other specified dermatitis: Secondary | ICD-10-CM | POA: Diagnosis not present

## 2016-11-16 DIAGNOSIS — L720 Epidermal cyst: Secondary | ICD-10-CM | POA: Diagnosis not present

## 2016-11-16 DIAGNOSIS — D2261 Melanocytic nevi of right upper limb, including shoulder: Secondary | ICD-10-CM | POA: Diagnosis not present

## 2016-11-16 DIAGNOSIS — L853 Xerosis cutis: Secondary | ICD-10-CM | POA: Diagnosis not present

## 2016-11-16 DIAGNOSIS — L814 Other melanin hyperpigmentation: Secondary | ICD-10-CM | POA: Diagnosis not present

## 2016-11-16 DIAGNOSIS — D1801 Hemangioma of skin and subcutaneous tissue: Secondary | ICD-10-CM | POA: Diagnosis not present

## 2016-11-16 DIAGNOSIS — L821 Other seborrheic keratosis: Secondary | ICD-10-CM | POA: Diagnosis not present

## 2016-12-08 DIAGNOSIS — M545 Low back pain: Secondary | ICD-10-CM | POA: Diagnosis not present

## 2016-12-12 DIAGNOSIS — M545 Low back pain: Secondary | ICD-10-CM | POA: Diagnosis not present

## 2016-12-12 DIAGNOSIS — M48061 Spinal stenosis, lumbar region without neurogenic claudication: Secondary | ICD-10-CM | POA: Diagnosis not present

## 2016-12-13 DIAGNOSIS — M545 Low back pain: Secondary | ICD-10-CM | POA: Diagnosis not present

## 2016-12-13 DIAGNOSIS — M48061 Spinal stenosis, lumbar region without neurogenic claudication: Secondary | ICD-10-CM | POA: Diagnosis not present

## 2016-12-16 DIAGNOSIS — Z23 Encounter for immunization: Secondary | ICD-10-CM | POA: Diagnosis not present

## 2016-12-19 DIAGNOSIS — M545 Low back pain: Secondary | ICD-10-CM | POA: Diagnosis not present

## 2016-12-19 DIAGNOSIS — M48061 Spinal stenosis, lumbar region without neurogenic claudication: Secondary | ICD-10-CM | POA: Diagnosis not present

## 2016-12-21 DIAGNOSIS — M545 Low back pain: Secondary | ICD-10-CM | POA: Diagnosis not present

## 2016-12-21 DIAGNOSIS — M48061 Spinal stenosis, lumbar region without neurogenic claudication: Secondary | ICD-10-CM | POA: Diagnosis not present

## 2016-12-23 IMAGING — CR DG CHEST 1V PORT
1 series · 1 of 1 positions shown · non-contrast
Comparison: None.

CLINICAL DATA: 73-year-old with chest pain for 1 day.

EXAM:
PORTABLE CHEST - 1 VIEW

[AP]
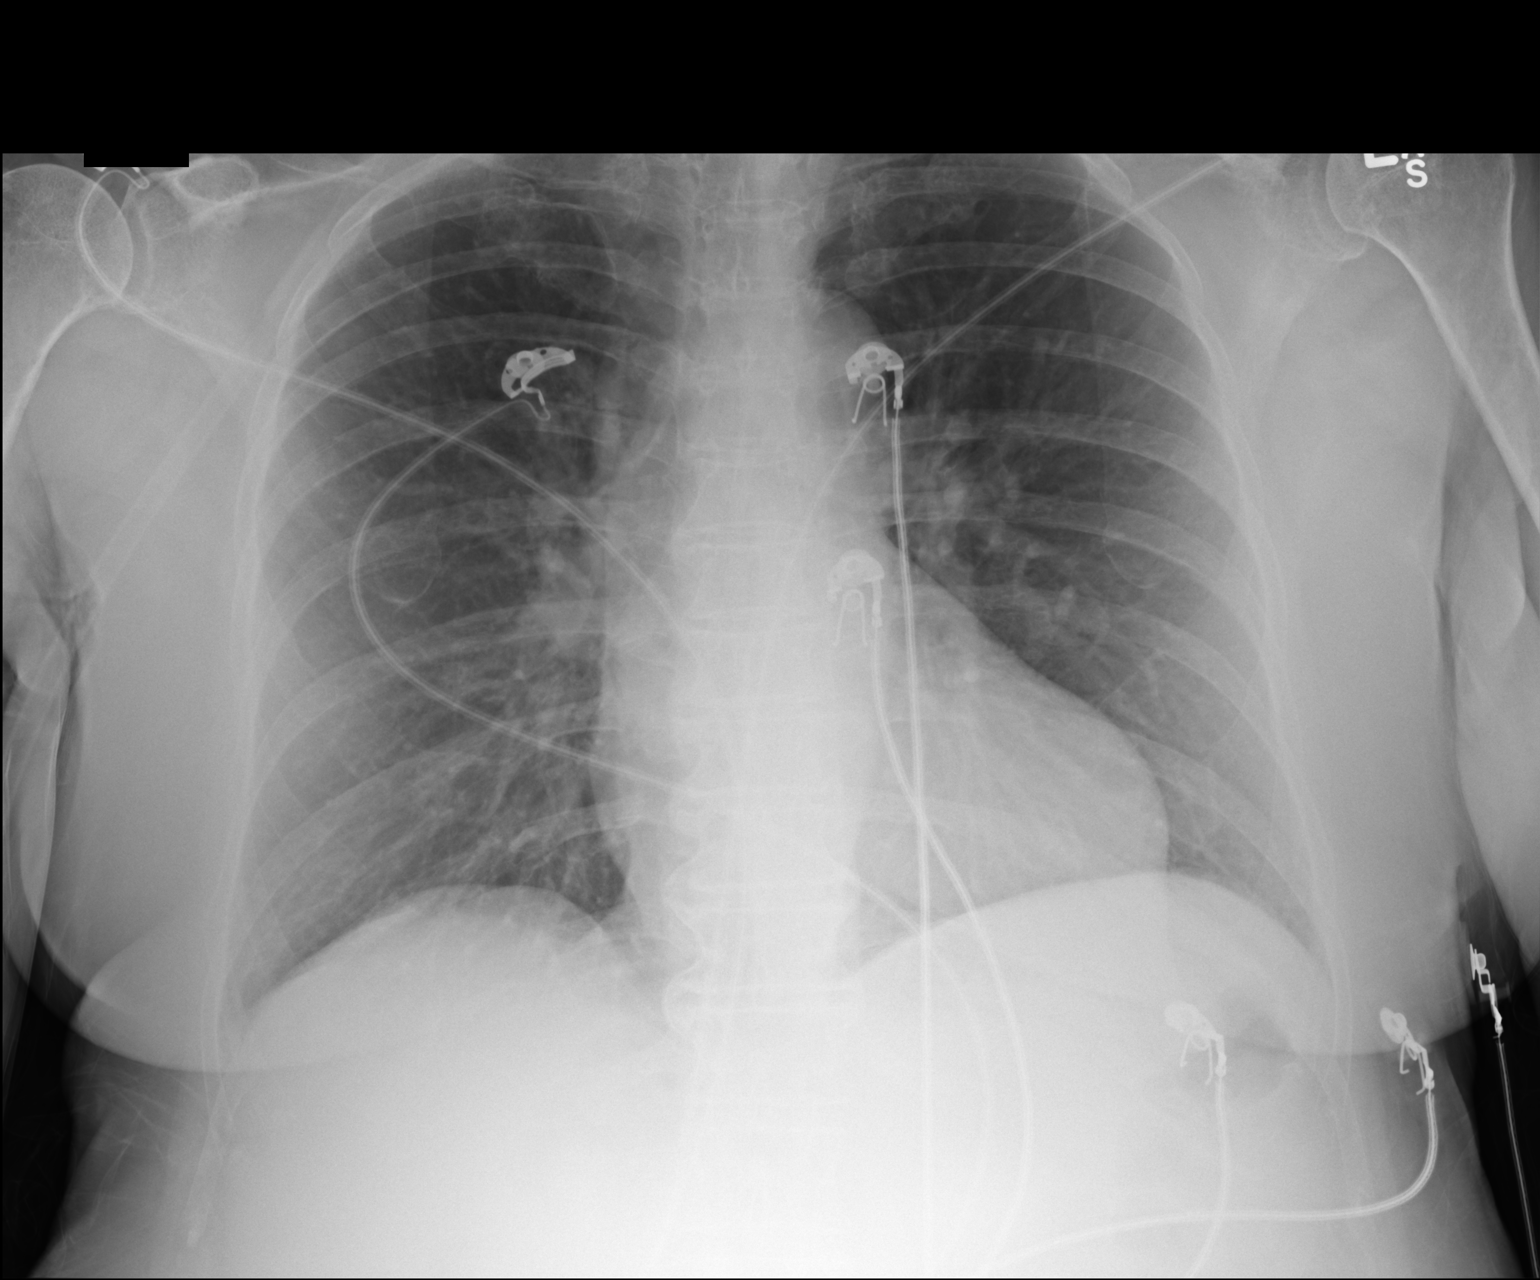

[1 of 1 positions shown; findings below may reference images not displayed]

FINDINGS: Heart and mediastinum are within normal limits. The lungs are clear
bilaterally. Bony thorax is intact. Negative for a pneumothorax.
There is a round dense structure overlying the medial left clavicle
and likely within bone.
IMPRESSION: No acute chest findings.

## 2016-12-26 DIAGNOSIS — M545 Low back pain: Secondary | ICD-10-CM | POA: Diagnosis not present

## 2016-12-26 DIAGNOSIS — M48061 Spinal stenosis, lumbar region without neurogenic claudication: Secondary | ICD-10-CM | POA: Diagnosis not present

## 2016-12-28 DIAGNOSIS — M48061 Spinal stenosis, lumbar region without neurogenic claudication: Secondary | ICD-10-CM | POA: Diagnosis not present

## 2016-12-28 DIAGNOSIS — M545 Low back pain: Secondary | ICD-10-CM | POA: Diagnosis not present

## 2017-01-02 DIAGNOSIS — M48061 Spinal stenosis, lumbar region without neurogenic claudication: Secondary | ICD-10-CM | POA: Diagnosis not present

## 2017-01-02 DIAGNOSIS — M545 Low back pain: Secondary | ICD-10-CM | POA: Diagnosis not present

## 2017-01-04 DIAGNOSIS — M545 Low back pain: Secondary | ICD-10-CM | POA: Diagnosis not present

## 2017-01-04 DIAGNOSIS — M48061 Spinal stenosis, lumbar region without neurogenic claudication: Secondary | ICD-10-CM | POA: Diagnosis not present

## 2017-01-09 DIAGNOSIS — M545 Low back pain: Secondary | ICD-10-CM | POA: Diagnosis not present

## 2017-01-09 DIAGNOSIS — M48061 Spinal stenosis, lumbar region without neurogenic claudication: Secondary | ICD-10-CM | POA: Diagnosis not present

## 2017-01-11 DIAGNOSIS — M545 Low back pain: Secondary | ICD-10-CM | POA: Diagnosis not present

## 2017-01-11 DIAGNOSIS — M48061 Spinal stenosis, lumbar region without neurogenic claudication: Secondary | ICD-10-CM | POA: Diagnosis not present

## 2017-01-16 DIAGNOSIS — M545 Low back pain: Secondary | ICD-10-CM | POA: Diagnosis not present

## 2017-01-16 DIAGNOSIS — M48061 Spinal stenosis, lumbar region without neurogenic claudication: Secondary | ICD-10-CM | POA: Diagnosis not present

## 2017-01-18 DIAGNOSIS — M545 Low back pain: Secondary | ICD-10-CM | POA: Diagnosis not present

## 2017-01-18 DIAGNOSIS — M48061 Spinal stenosis, lumbar region without neurogenic claudication: Secondary | ICD-10-CM | POA: Diagnosis not present

## 2017-01-23 DIAGNOSIS — M545 Low back pain: Secondary | ICD-10-CM | POA: Diagnosis not present

## 2017-01-23 DIAGNOSIS — M48061 Spinal stenosis, lumbar region without neurogenic claudication: Secondary | ICD-10-CM | POA: Diagnosis not present

## 2017-01-30 DIAGNOSIS — M545 Low back pain: Secondary | ICD-10-CM | POA: Diagnosis not present

## 2017-01-30 DIAGNOSIS — M48061 Spinal stenosis, lumbar region without neurogenic claudication: Secondary | ICD-10-CM | POA: Diagnosis not present

## 2017-02-01 DIAGNOSIS — M545 Low back pain: Secondary | ICD-10-CM | POA: Diagnosis not present

## 2017-02-01 DIAGNOSIS — M48061 Spinal stenosis, lumbar region without neurogenic claudication: Secondary | ICD-10-CM | POA: Diagnosis not present

## 2017-04-09 ENCOUNTER — Other Ambulatory Visit: Payer: Self-pay | Admitting: Interventional Cardiology

## 2017-05-29 ENCOUNTER — Other Ambulatory Visit: Payer: Self-pay | Admitting: Interventional Cardiology

## 2017-06-26 DIAGNOSIS — D1801 Hemangioma of skin and subcutaneous tissue: Secondary | ICD-10-CM | POA: Diagnosis not present

## 2017-06-26 DIAGNOSIS — L814 Other melanin hyperpigmentation: Secondary | ICD-10-CM | POA: Diagnosis not present

## 2017-06-26 DIAGNOSIS — L853 Xerosis cutis: Secondary | ICD-10-CM | POA: Diagnosis not present

## 2017-06-26 DIAGNOSIS — L821 Other seborrheic keratosis: Secondary | ICD-10-CM | POA: Diagnosis not present

## 2017-06-26 DIAGNOSIS — D2261 Melanocytic nevi of right upper limb, including shoulder: Secondary | ICD-10-CM | POA: Diagnosis not present

## 2017-06-26 DIAGNOSIS — D225 Melanocytic nevi of trunk: Secondary | ICD-10-CM | POA: Diagnosis not present

## 2017-07-07 ENCOUNTER — Other Ambulatory Visit: Payer: Self-pay | Admitting: Interventional Cardiology

## 2017-07-09 ENCOUNTER — Other Ambulatory Visit: Payer: Self-pay | Admitting: Interventional Cardiology

## 2017-08-05 ENCOUNTER — Other Ambulatory Visit: Payer: Self-pay | Admitting: Interventional Cardiology

## 2017-08-08 DIAGNOSIS — R7302 Impaired glucose tolerance (oral): Secondary | ICD-10-CM | POA: Diagnosis not present

## 2017-08-08 DIAGNOSIS — I10 Essential (primary) hypertension: Secondary | ICD-10-CM | POA: Diagnosis not present

## 2017-08-08 DIAGNOSIS — M859 Disorder of bone density and structure, unspecified: Secondary | ICD-10-CM | POA: Diagnosis not present

## 2017-08-08 DIAGNOSIS — R82998 Other abnormal findings in urine: Secondary | ICD-10-CM | POA: Diagnosis not present

## 2017-08-08 DIAGNOSIS — E7800 Pure hypercholesterolemia, unspecified: Secondary | ICD-10-CM | POA: Diagnosis not present

## 2017-08-15 DIAGNOSIS — Z1389 Encounter for screening for other disorder: Secondary | ICD-10-CM | POA: Diagnosis not present

## 2017-08-15 DIAGNOSIS — D692 Other nonthrombocytopenic purpura: Secondary | ICD-10-CM | POA: Diagnosis not present

## 2017-08-15 DIAGNOSIS — Z0000 Encounter for general adult medical examination without abnormal findings: Secondary | ICD-10-CM | POA: Diagnosis not present

## 2017-08-15 DIAGNOSIS — I208 Other forms of angina pectoris: Secondary | ICD-10-CM | POA: Diagnosis not present

## 2017-08-15 DIAGNOSIS — S069X0S Unspecified intracranial injury without loss of consciousness, sequela: Secondary | ICD-10-CM | POA: Diagnosis not present

## 2017-08-15 DIAGNOSIS — R808 Other proteinuria: Secondary | ICD-10-CM | POA: Diagnosis not present

## 2017-08-15 DIAGNOSIS — Z6831 Body mass index (BMI) 31.0-31.9, adult: Secondary | ICD-10-CM | POA: Diagnosis not present

## 2017-08-15 DIAGNOSIS — I1310 Hypertensive heart and chronic kidney disease without heart failure, with stage 1 through stage 4 chronic kidney disease, or unspecified chronic kidney disease: Secondary | ICD-10-CM | POA: Diagnosis not present

## 2017-08-15 DIAGNOSIS — I252 Old myocardial infarction: Secondary | ICD-10-CM | POA: Diagnosis not present

## 2017-08-15 DIAGNOSIS — I5032 Chronic diastolic (congestive) heart failure: Secondary | ICD-10-CM | POA: Diagnosis not present

## 2017-08-15 DIAGNOSIS — N182 Chronic kidney disease, stage 2 (mild): Secondary | ICD-10-CM | POA: Diagnosis not present

## 2017-08-15 DIAGNOSIS — I25810 Atherosclerosis of coronary artery bypass graft(s) without angina pectoris: Secondary | ICD-10-CM | POA: Diagnosis not present

## 2017-09-04 ENCOUNTER — Other Ambulatory Visit: Payer: Self-pay | Admitting: Interventional Cardiology

## 2017-10-05 ENCOUNTER — Other Ambulatory Visit: Payer: Self-pay | Admitting: Interventional Cardiology

## 2017-10-07 ENCOUNTER — Other Ambulatory Visit: Payer: Self-pay | Admitting: Interventional Cardiology
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
## 2017-11-28 ENCOUNTER — Ambulatory Visit (INDEPENDENT_AMBULATORY_CARE_PROVIDER_SITE_OTHER): Payer: PPO | Admitting: Interventional Cardiology

## 2017-11-28 ENCOUNTER — Encounter: Payer: Self-pay | Admitting: Interventional Cardiology

## 2017-11-28 ENCOUNTER — Ambulatory Visit: Payer: PPO | Admitting: Interventional Cardiology

## 2017-11-28 VITALS — BP 148/78 | HR 60 | Ht 64.5 in | Wt 180.2 lb

## 2017-11-28 DIAGNOSIS — Z1231 Encounter for screening mammogram for malignant neoplasm of breast: Secondary | ICD-10-CM | POA: Diagnosis not present

## 2017-11-28 DIAGNOSIS — I5032 Chronic diastolic (congestive) heart failure: Secondary | ICD-10-CM | POA: Diagnosis not present

## 2017-11-28 DIAGNOSIS — E7800 Pure hypercholesterolemia, unspecified: Secondary | ICD-10-CM

## 2017-11-28 DIAGNOSIS — I10 Essential (primary) hypertension: Secondary | ICD-10-CM

## 2017-11-28 DIAGNOSIS — I25119 Atherosclerotic heart disease of native coronary artery with unspecified angina pectoris: Secondary | ICD-10-CM | POA: Diagnosis not present

## 2017-11-28 NOTE — Patient Instructions (Signed)
Medication Instructions:  Your physician recommends that you continue on your current medications as directed. Please refer to the Current Medication list given to you today.   Labwork: None ordered  Testing/Procedures: None ordered  Follow-Up: Your physician recommends that you schedule a follow-up appointment in: 1 month in the Hypertension Clinic for Blood Pressure Management   Your physician wants you to follow-up in: 1 year with Dr. Tamala Julian. You will receive a reminder letter in the mail two months in advance. If you don't receive a letter, please call our office to schedule the follow-up appointment.   Any Other Special Instructions Will Be Listed Below (If Applicable).   DECREASE THE AMOUNT OF SALT IN YOUR DIET   Low-Sodium Eating Plan Sodium, which is an element that makes up salt, helps you maintain a healthy balance of fluids in your body. Too much sodium can increase your blood pressure and cause fluid and waste to be held in your body. Your health care provider or dietitian may recommend following this plan if you have high blood pressure (hypertension), kidney disease, liver disease, or heart failure. Eating less sodium can help lower your blood pressure, reduce swelling, and protect your heart, liver, and kidneys. What are tips for following this plan? General guidelines  Most people on this plan should limit their sodium intake to 1,500-2,000 mg (milligrams) of sodium each day. Reading food labels  The Nutrition Facts label lists the amount of sodium in one serving of the food. If you eat more than one serving, you must multiply the listed amount of sodium by the number of servings.  Choose foods with less than 140 mg of sodium per serving.  Avoid foods with 300 mg of sodium or more per serving. Shopping  Look for lower-sodium products, often labeled as "low-sodium" or "no salt added."  Always check the sodium content even if foods are labeled as "unsalted" or "no  salt added".  Buy fresh foods. ? Avoid canned foods and premade or frozen meals. ? Avoid canned, cured, or processed meats  Buy breads that have less than 80 mg of sodium per slice. Cooking  Eat more home-cooked food and less restaurant, buffet, and fast food.  Avoid adding salt when cooking. Use salt-free seasonings or herbs instead of table salt or sea salt. Check with your health care provider or pharmacist before using salt substitutes.  Cook with plant-based oils, such as canola, sunflower, or olive oil. Meal planning  When eating at a restaurant, ask that your food be prepared with less salt or no salt, if possible.  Avoid foods that contain MSG (monosodium glutamate). MSG is sometimes added to [place] food, bouillon, and some canned foods. What foods are recommended? The items listed may not be a complete list. Talk with your dietitian about what dietary choices are best for you. Grains Low-sodium cereals, including oats, puffed wheat and rice, and shredded wheat. Low-sodium crackers. Unsalted rice. Unsalted pasta. Low-sodium bread. Whole-grain breads and whole-grain pasta. Vegetables Fresh or frozen vegetables. "No salt added" canned vegetables. "No salt added" tomato sauce and paste. Low-sodium or reduced-sodium tomato and vegetable juice. Fruits Fresh, frozen, or canned fruit. Fruit juice. Meats and other protein foods Fresh or frozen (no salt added) meat, poultry, seafood, and fish. Low-sodium canned tuna and salmon. Unsalted nuts. Dried peas, beans, and lentils without added salt. Unsalted canned beans. Eggs. Unsalted nut butters. Dairy Milk. Soy milk. Cheese that is naturally low in sodium, such as ricotta cheese, fresh mozzarella, or Swiss  cheese Low-sodium or reduced-sodium cheese. Cream cheese. Yogurt. Fats and oils Unsalted butter. Unsalted margarine with no trans fat. Vegetable oils such as canola or olive oils. Seasonings and other foods Fresh and dried herbs and  spices. Salt-free seasonings. Low-sodium mustard and ketchup. Sodium-free salad dressing. Sodium-free light mayonnaise. Fresh or refrigerated horseradish. Lemon juice. Vinegar. Homemade, reduced-sodium, or low-sodium soups. Unsalted popcorn and pretzels. Low-salt or salt-free chips. What foods are not recommended? The items listed may not be a complete list. Talk with your dietitian about what dietary choices are best for you. Grains Instant hot cereals. Bread stuffing, pancake, and biscuit mixes. Croutons. Seasoned rice or pasta mixes. Noodle soup cups. Boxed or frozen macaroni and cheese. Regular salted crackers. Self-rising flour. Vegetables Sauerkraut, pickled vegetables, and relishes. Olives. [place] fries. Onion rings. Regular canned vegetables (not low-sodium or reduced-sodium). Regular canned tomato sauce and paste (not low-sodium or reduced-sodium). Regular tomato and vegetable juice (not low-sodium or reduced-sodium). Frozen vegetables in sauces. Meats and other protein foods Meat or fish that is salted, canned, smoked, spiced, or pickled. Bacon, ham, sausage, hotdogs, corned beef, chipped beef, packaged lunch meats, salt pork, jerky, pickled herring, anchovies, regular canned tuna, sardines, salted nuts. Dairy Processed cheese and cheese spreads. Cheese curds. Blue cheese. Feta cheese. String cheese. Regular cottage cheese. Buttermilk. Canned milk. Fats and oils Salted butter. Regular margarine. Ghee. Bacon fat. Seasonings and other foods Onion salt, garlic salt, seasoned salt, table salt, and sea salt. Canned and packaged gravies. Worcestershire sauce. Tartar sauce. Barbecue sauce. Teriyaki sauce. Soy sauce, including reduced-sodium. Steak sauce. Fish sauce. Oyster sauce. Cocktail sauce. Horseradish that you find on the shelf. Regular ketchup and mustard. Meat flavorings and tenderizers. Bouillon cubes. Hot sauce and Tabasco sauce. Premade or packaged marinades. Premade or packaged taco  seasonings. Relishes. Regular salad dressings. Salsa. Potato and tortilla chips. Corn chips and puffs. Salted popcorn and pretzels. Canned or dried soups. Pizza. Frozen entrees and pot pies. Summary  Eating less sodium can help lower your blood pressure, reduce swelling, and protect your heart, liver, and kidneys.  Most people on this plan should limit their sodium intake to 1,500-2,000 mg (milligrams) of sodium each day.  Canned, boxed, and frozen foods are high in sodium. Restaurant foods, fast foods, and pizza are also very high in sodium. You also get sodium by adding salt to food.  Try to cook at home, eat more fresh fruits and vegetables, and eat less fast food, canned, processed, or prepared foods. This information is not intended to replace advice given to you by your health care provider. Make sure you discuss any questions you have with your health care provider. Document Released: 08/12/2001 Document Revised: 02/14/2016 Document Reviewed: 02/14/2016 Elsevier Interactive Patient Education  [employer].    If you need a refill on your cardiac medications before your next appointment, please call your pharmacy.

## 2017-12-11 ENCOUNTER — Other Ambulatory Visit: Payer: Self-pay | Admitting: Interventional Cardiology

## 2017-12-11 MED ORDER — METOPROLOL TARTRATE 25 MG PO TABS: 25.0000 mg | ORAL_TABLET | Freq: Two times a day (BID) | ORAL | 3 refills | Status: DC

## 2017-12-11 MED ORDER — RAMIPRIL 5 MG PO CAPS: 5.0000 mg | ORAL_CAPSULE | Freq: Every day | ORAL | 3 refills | Status: DC

## 2017-12-11 NOTE — Telephone Encounter (Signed)
Pt's medications were sent to pt's pharmacy as requested. Confirmation received.  

## 2017-12-11 NOTE — Telephone Encounter (Signed)
° ° °*  STAT* If patient is at the pharmacy, call can be transferred to refill team.   1. Which medications need to be refilled? (please list name of each medication and dose if known) metoprolol tartrate (LOPRESSOR) 25 MG tablet, ramipril (ALTACE) 5 MG capsule  2. Which pharmacy/location (including street and city if local pharmacy) is medication to be sent to?[id number] - [place], [place] - [address] AT [address]  3. Do they need a 30 day or 90 day supply? [place]

## 2017-12-27 ENCOUNTER — Ambulatory Visit (INDEPENDENT_AMBULATORY_CARE_PROVIDER_SITE_OTHER): Payer: PPO | Admitting: Pharmacist

## 2017-12-27 VITALS — BP 144/78 | HR 61

## 2017-12-27 DIAGNOSIS — I10 Essential (primary) hypertension: Secondary | ICD-10-CM | POA: Diagnosis not present

## 2017-12-27 MED ORDER — VALSARTAN 160 MG PO TABS: 160.0000 mg | ORAL_TABLET | Freq: Every day | ORAL | 11 refills | Status: DC

## 2017-12-27 MED ORDER — ATORVASTATIN CALCIUM 40 MG PO TABS: 40.0000 mg | ORAL_TABLET | Freq: Every day | ORAL | 3 refills | Status: DC

## 2017-12-27 NOTE — Patient Instructions (Addendum)
It was nice to meet you today  Your blood pressure goal is < 130/79mmHg  STOP taking ramipril  START taking valsartan 160mg  once a day  INCREASE your atorvastatin to 40mg  each day  Continue taking your other medications  Continue limiting your salt intake to < 1,500mg  daily and continue to stay active  Follow up in clinic in 4 weeks for a blood pressure check. Call [place], Pharmacist with any questions [phone]

## 2017-12-27 NOTE — Progress Notes (Signed)
Patient ID: [name]                 DOB: May 08, 1940                      MRN: [id number]     HPI: Debra Thompson is a 77 y.o. female referred by Dr. Tamala Thompson to HTN clinic. PMH is significant for NSTEMI, distal LAD stenosis treated medically, diastolic CHF, HTN, and HLD. He was seen in clinic 1 month ago and BP was elevated at 148/78. Pt was encouraged to decrease sodium in his diet and he presents today for follow up.  Pt presents today in good spirits. She does report a dry cough with her ramipril that is bothersome. Reports compliance with her medications and took her AM doses > 2 hours ago. She has kept a detailed log of her BP readings - most range 110-140/50-70s, average 134/67, HR 62. She uses a wrist BP cuff that she has had for 1 month. BP readings lower when she is relaxing and reading. Avoids caffeine as this causes palpitations. Tries to limit salt intake to < 1,500mg  daily.   Clinic reading: 144/78 Home wrist cuff reading: 153/78  Current HTN meds: metoprolol tartrate 25mg  BID, ramipril 5mg  daily  BP goal: <130/100mmHg  Family History: The patient's family history includes Brain cancer in her brother; Colon cancer in her cousin and maternal uncle; Diabetes in her mother; Heart disease (age of onset: 20) in her father; Stroke in her mother.  Social History: Drinks 4 glasses of wine per week. Denies tobacco and illicit drug use.  Diet: Trying to limit salt to < 1,500mg  a day. Breakfast - banana. Does not add salt to food, eats more meals at home. Avoids caffeine - causes palpitations.  Exercise: Works in the yard frequently.  Home BP readings: Uses wrist cuff, has had for 1 month. Checking BP various times throughout the day. Most readings 110-140s/50-70s, HR 50-60s. Average BP 134/67, HR 62  Labs: 08/2017: Scr 0.7, K 3.7, LDL 69 on atorvastatin 10mg  daily   Wt Readings from Last 3 Encounters:  11/28/17 180 lb 3.2 oz (81.7 kg)  05/19/16 177 lb (80.3 kg)  05/20/15 176 lb  (79.8 kg)   BP Readings from Last 3 Encounters:  11/28/17 (!) 148/78  05/19/16 132/80  05/13/15 130/74   Pulse Readings from Last 3 Encounters:  11/28/17 60  05/19/16 65  05/13/15 (!) 58    Renal function: CrCl cannot be calculated (Patient's most recent lab result is older than the maximum 21 days allowed.).  Past Medical History:  Diagnosis Date  . Allergy   . Arthritis   . CAD (coronary artery disease) 05/01/14   NSTEMI  . Hypercholesteremia   . Hypertension     Current Outpatient Medications on File Prior to Visit  Medication Sig Dispense Refill  . aspirin EC 81 MG EC tablet Take 1 tablet (81 mg total) by mouth daily.    Marland Kitchen atorvastatin (LIPITOR) 10 MG tablet Take 1 tablet (10 mg total) by mouth daily at 6 PM. 90 tablet 3  . cholecalciferol (VITAMIN D) 1000 UNITS tablet Take 1,000 Units by mouth daily.    . Coenzyme Q10 (CO Q 10) 100 MG CAPS Take 100 mg by mouth daily.    Marland Kitchen loratadine (CLARITIN) 10 MG tablet Take 10 mg by mouth daily.    . metoprolol tartrate (LOPRESSOR) 25 MG tablet Take 1 tablet (25 mg total) by mouth 2 (  two) times daily. 180 tablet 3  . nitroGLYCERIN (NITROSTAT) 0.4 MG SL tablet TAKE 1 TABLET UNDER THE TONGUE EVERY 5 MINUTES FOR 3 DOSES AS NEEDED FOR CHEST PAIN 25 tablet 5  . Omega-3 Fatty Acids (FISH OIL) 1000 MG CAPS Take 1,000 mg by mouth daily.    Marland Kitchen OVER THE COUNTER MEDICATION KIRKLAND MULTIGUMMIES - Take two (2) chews by mouth daily with breakfast.    . ramipril (ALTACE) 5 MG capsule Take 1 capsule (5 mg total) by mouth daily. 90 capsule 3   No current facility-administered medications on file prior to visit.     Allergies  Allergen Reactions  . Iodine Rash    rash     Assessment/Plan:  1. Hypertension - BP above goal <130/46mmHg in clinic. Will stop ramipril due to cough and start valsartan at higher than equivalent dose of 160mg  daily to target lower BP. Continue metoprolol tartrate 25mg  BID - HR 61 today. Encouraged pt to continue with  low sodium diet and continue activity. F/u in HTN clinic in 1 month for BP check.  2. Hyperlipidemia - LDL 69 on atorvastatin 10mg  daily, barely at goal 70mg /dL. Will increase to atorvastatin 40mg  daily due to history of NSTEMI and newer PCSK9i data suggesting that the lower we can keep her LDL, the lower her risk for future ASCVD events. Will plan to recheck lipids in 2-3 months and will assess tolerability of higher dose at next visit.   Shalynn Jorstad E. Haja Crego, PharmD, BCACP, [place] [address], [place], [place] [address] Phone: [phone]; Fax: [phone] 12/27/2017 11:38 AM

## 2018-01-24 ENCOUNTER — Ambulatory Visit (INDEPENDENT_AMBULATORY_CARE_PROVIDER_SITE_OTHER): Payer: PPO | Admitting: Pharmacist

## 2018-01-24 ENCOUNTER — Encounter: Payer: Self-pay | Admitting: Pharmacist

## 2018-01-24 VITALS — BP 128/68 | HR 60

## 2018-01-24 DIAGNOSIS — I10 Essential (primary) hypertension: Secondary | ICD-10-CM | POA: Diagnosis not present

## 2018-01-24 DIAGNOSIS — E7800 Pure hypercholesterolemia, unspecified: Secondary | ICD-10-CM | POA: Diagnosis not present

## 2018-01-24 LAB — BASIC METABOLIC PANEL
BUN/Creatinine Ratio: 17 (ref 12–28)
BUN: 10 mg/dL (ref 8–27)
CO2: 23 mmol/L (ref 20–29)
Calcium: 9.3 mg/dL (ref 8.7–10.3)
Chloride: 105 mmol/L (ref 96–106)
Creatinine, Ser: 0.6 mg/dL (ref 0.57–1.00)
GFR calc Af Amer: 101 mL/min/{1.73_m2} (ref >59)
GFR calc non Af Amer: 88 mL/min/{1.73_m2} (ref >59)
Glucose: 118 mg/dL — ABNORMAL HIGH (ref 65–99)
Potassium: 4.6 mmol/L (ref 3.5–5.2)
Sodium: 142 mmol/L (ref 134–144)

## 2018-01-24 NOTE — Patient Instructions (Addendum)
Return for a follow up appointment for cholesterol labs in 1 month.  Go to the lab today for BMET  Your blood pressure goal is less than 130/80  Check your blood pressure at home daily (if able) and keep record of the readings.  Take your BP meds as follows: CONTINUE metoprolol tartrate 25mg  TWICE daily and valsartan 160mg  once daily  There should be a prescription on hold for atorvastatin 40mg  at the pharmacy.   If you have any questions call us at [phone]  Bring all of your meds, your BP cuff and your record of home blood pressures to your next appointment.  Exercise as you're able, try to walk approximately 30 minutes per day.  Keep salt intake to a minimum, especially watch canned and prepared boxed foods.  Eat more fresh fruits and vegetables and fewer canned items.  Avoid eating in fast food restaurants.    HOW TO TAKE YOUR BLOOD PRESSURE: . Rest 5 minutes before taking your blood pressure. .  Don't smoke or drink caffeinated beverages for at least 30 minutes before. . Take your blood pressure before (not after) you eat. . Sit comfortably with your back supported and both feet on the floor (don't cross your legs). . Elevate your arm to heart level on a table or a desk. . Use the proper sized cuff. It should fit smoothly and snugly around your bare upper arm. There should be enough room to slip a fingertip under the cuff. The bottom edge of the cuff should be 1 inch above the crease of the elbow. . Ideally, take 3 measurements at one sitting and record the average.

## 2018-01-24 NOTE — Progress Notes (Signed)
Patient ID: [name]                 DOB: 1940/09/14                      MRN: [id number]     HPI: Debra Thompson is a 77 y.o. female referred by Dr. Tamala Julian to HTN clinic. PMH is significant for NSTEMI, distal LAD stenosis treated medically, diastolic CHF, HTN, and HLD. She was seen in clinic 1 month ago and BP was elevated at 148/78. At last visit with hypertension clinic, ramipril was discontinued due to cough and valsartan 160mg  daily was initiated. Atorvastatin was also increased to 40mg  daily.   Pt presents today in good spirits. She reports that she is still coughing, but it is not happening as often as when she was on the ramipril. She has not noticed any difference in the way that she feels with the switch to valsartan. She reports that her BP readings are lower when she is relaxing and reading and they elevate when she is active. Patient reports that on average she has noticed her blood pressure is lower than before she started on valsartan 160mg  daily.   Patient reports no difference in how she feels with the increased dose of atorvastatin. She denies myalgias or joint pain.   Current HTN meds: metoprolol tartrate 25mg  BID, valsartan 160mg  daily   BP goal: <130/91mmHg  Previously tried: ramipril (cough)  Family History: The patient's family history includes Brain cancer in her brother; Colon cancer in her cousin and maternal uncle; Diabetes in her mother; Heart disease (age of onset: 32) in her father; Stroke in her mother.  Social History: Drinks 4 glasses of wine per week. Denies tobacco and illicit drug use.  Diet: Trying to limit salt to < 1,500mg  a day. Breakfast - banana. Does not add salt to food, eats more meals at home. Avoids caffeine - causes palpitations.  Exercise: Works in the yard frequently.  Home BP readings: 140/66, 136/67, 138/66, 134/68, 126/60, 151/70, 122/68  Labs: 08/2017: Scr 0.7, K 3.7, LDL 69 on atorvastatin 10mg  daily   Wt Readings from Last 3  Encounters:  11/28/17 180 lb 3.2 oz (81.7 kg)  05/19/16 177 lb (80.3 kg)  05/20/15 176 lb (79.8 kg)   BP Readings from Last 3 Encounters:  12/27/17 (!) 144/78  11/28/17 (!) 148/78  05/19/16 132/80   Pulse Readings from Last 3 Encounters:  12/27/17 61  11/28/17 60  05/19/16 65    Renal function: CrCl cannot be calculated (Patient's most recent lab result is older than the maximum 21 days allowed.).  Past Medical History:  Diagnosis Date  . Allergy   . Arthritis   . CAD (coronary artery disease) 05/01/14   NSTEMI  . Hypercholesteremia   . Hypertension     Current Outpatient Medications on File Prior to Visit  Medication Sig Dispense Refill  . aspirin EC 81 MG EC tablet Take 1 tablet (81 mg total) by mouth daily.    Marland Kitchen atorvastatin (LIPITOR) 40 MG tablet Take 1 tablet (40 mg total) by mouth daily. 90 tablet 3  . cholecalciferol (VITAMIN D) 1000 UNITS tablet Take 1,000 Units by mouth daily.    . Coenzyme Q10 (CO Q 10) 100 MG CAPS Take 100 mg by mouth daily.    Marland Kitchen loratadine (CLARITIN) 10 MG tablet Take 10 mg by mouth daily.    . metoprolol tartrate (LOPRESSOR) 25 MG tablet Take 1  tablet (25 mg total) by mouth 2 (two) times daily. 180 tablet 3  . nitroGLYCERIN (NITROSTAT) 0.4 MG SL tablet TAKE 1 TABLET UNDER THE TONGUE EVERY 5 MINUTES FOR 3 DOSES AS NEEDED FOR CHEST PAIN 25 tablet 5  . Omega-3 Fatty Acids (FISH OIL) 1000 MG CAPS Take 1,000 mg by mouth daily.    Marland Kitchen OVER THE COUNTER MEDICATION KIRKLAND MULTIGUMMIES - Take two (2) chews by mouth daily with breakfast.    . valsartan (DIOVAN) 160 MG tablet Take 1 tablet (160 mg total) by mouth daily. 30 tablet 11   No current facility-administered medications on file prior to visit.     Allergies  Allergen Reactions  . Ramipril     cough  . Iodine Rash    rash     Assessment/Plan:  1. Hypertension - BP at goal of <130/3mmHg in clinic. Patient's home readings are above goal, but in the past when comparing her home BP monitor  to office measurements, her monitor's readings are higher than manual office readings. Since she is at goal in office, and near goal with her home monitor, this warrants no change in her BP medication today. Continue taking metoprolol tartrate 25mg  BID and valsartan 160mg  daily. BMET today to check renal function and electrolytes with change to valsartan at last visit. Counseled patient to continue checking blood pressure at home and to call if her blood pressure goes back up. Follow up with cardiologist as scheduled.   2. Hyperlipidemia - Follow up in 1 month for fasting lipid panel and hepatic function tests.  Patient seen with Cleotis Lema, PharmD Student  Thank you, Lelan Pons. [facility], [place]  [address], [place],  [address]  Phone: [phone]; Fax: [phone] 01/24/2018 8:59 AM

## 2018-01-29 ENCOUNTER — Other Ambulatory Visit: Payer: Self-pay | Admitting: Pharmacist

## 2018-01-29 MED ORDER — ATORVASTATIN CALCIUM 40 MG PO TABS: 40.0000 mg | ORAL_TABLET | Freq: Every day | ORAL | 3 refills | Status: DC

## 2018-02-21 ENCOUNTER — Other Ambulatory Visit: Payer: PPO

## 2018-02-21 DIAGNOSIS — E7800 Pure hypercholesterolemia, unspecified: Secondary | ICD-10-CM

## 2018-02-21 LAB — HEPATIC FUNCTION PANEL
ALT: 26 IU/L (ref 0–32)
AST: 23 IU/L (ref 0–40)
Albumin: 4 g/dL (ref 3.5–4.8)
Alkaline Phosphatase: 83 IU/L (ref 39–117)
Bilirubin Total: 0.9 mg/dL (ref 0.0–1.2)
Bilirubin, Direct: 0.19 mg/dL (ref 0.00–0.40)
Total Protein: 6.1 g/dL (ref 6.0–8.5)

## 2018-02-21 LAB — LIPID PANEL
Chol/HDL Ratio: 2.7 ratio (ref 0.0–4.4)
Cholesterol, Total: 120 mg/dL (ref 100–199)
HDL: 45 mg/dL (ref >39)
LDL Calculated: 49 mg/dL (ref 0–99)
Triglycerides: 132 mg/dL (ref 0–149)
VLDL Cholesterol Cal: 26 mg/dL (ref 5–40)

## 2018-02-22 ENCOUNTER — Telehealth: Payer: Self-pay | Admitting: Pharmacist

## 2018-02-22 ENCOUNTER — Ambulatory Visit: Payer: PPO

## 2018-02-22 NOTE — Telephone Encounter (Signed)
Spoke with patient about cholesterol results. She will continue on her current regimen as LDL and TG now at goal with normal LFTs.   She states understanding and will call with any issues or concerns.

## 2018-03-07 ENCOUNTER — Other Ambulatory Visit: Payer: Self-pay | Admitting: Interventional Cardiology

## 2018-08-15 DIAGNOSIS — E7800 Pure hypercholesterolemia, unspecified: Secondary | ICD-10-CM | POA: Diagnosis not present

## 2018-08-15 DIAGNOSIS — I10 Essential (primary) hypertension: Secondary | ICD-10-CM | POA: Diagnosis not present

## 2018-08-15 DIAGNOSIS — M859 Disorder of bone density and structure, unspecified: Secondary | ICD-10-CM | POA: Diagnosis not present

## 2018-08-16 DIAGNOSIS — I10 Essential (primary) hypertension: Secondary | ICD-10-CM | POA: Diagnosis not present

## 2018-08-16 DIAGNOSIS — R82998 Other abnormal findings in urine: Secondary | ICD-10-CM | POA: Diagnosis not present

## 2018-08-22 DIAGNOSIS — E119 Type 2 diabetes mellitus without complications: Secondary | ICD-10-CM | POA: Diagnosis not present

## 2018-08-22 DIAGNOSIS — N182 Chronic kidney disease, stage 2 (mild): Secondary | ICD-10-CM | POA: Diagnosis not present

## 2018-08-22 DIAGNOSIS — S069X0S Unspecified intracranial injury without loss of consciousness, sequela: Secondary | ICD-10-CM | POA: Diagnosis not present

## 2018-08-22 DIAGNOSIS — Z1339 Encounter for screening examination for other mental health and behavioral disorders: Secondary | ICD-10-CM | POA: Diagnosis not present

## 2018-08-22 DIAGNOSIS — I5032 Chronic diastolic (congestive) heart failure: Secondary | ICD-10-CM | POA: Diagnosis not present

## 2018-08-22 DIAGNOSIS — E1129 Type 2 diabetes mellitus with other diabetic kidney complication: Secondary | ICD-10-CM | POA: Diagnosis not present

## 2018-08-22 DIAGNOSIS — I252 Old myocardial infarction: Secondary | ICD-10-CM | POA: Diagnosis not present

## 2018-08-22 DIAGNOSIS — Z1331 Encounter for screening for depression: Secondary | ICD-10-CM | POA: Diagnosis not present

## 2018-08-22 DIAGNOSIS — I1310 Hypertensive heart and chronic kidney disease without heart failure, with stage 1 through stage 4 chronic kidney disease, or unspecified chronic kidney disease: Secondary | ICD-10-CM | POA: Diagnosis not present

## 2018-08-22 DIAGNOSIS — R809 Proteinuria, unspecified: Secondary | ICD-10-CM | POA: Diagnosis not present

## 2018-08-22 DIAGNOSIS — Z0000 Encounter for general adult medical examination without abnormal findings: Secondary | ICD-10-CM | POA: Diagnosis not present

## 2018-08-22 DIAGNOSIS — I25810 Atherosclerosis of coronary artery bypass graft(s) without angina pectoris: Secondary | ICD-10-CM | POA: Diagnosis not present

## 2018-08-22 DIAGNOSIS — D692 Other nonthrombocytopenic purpura: Secondary | ICD-10-CM | POA: Diagnosis not present

## 2018-08-22 DIAGNOSIS — E669 Obesity, unspecified: Secondary | ICD-10-CM | POA: Diagnosis not present

## 2018-08-22 DIAGNOSIS — I209 Angina pectoris, unspecified: Secondary | ICD-10-CM | POA: Diagnosis not present

## 2018-09-11 DIAGNOSIS — I1310 Hypertensive heart and chronic kidney disease without heart failure, with stage 1 through stage 4 chronic kidney disease, or unspecified chronic kidney disease: Secondary | ICD-10-CM | POA: Diagnosis not present

## 2018-09-11 DIAGNOSIS — N182 Chronic kidney disease, stage 2 (mild): Secondary | ICD-10-CM | POA: Diagnosis not present

## 2018-09-11 DIAGNOSIS — E119 Type 2 diabetes mellitus without complications: Secondary | ICD-10-CM | POA: Diagnosis not present

## 2018-10-14 ENCOUNTER — Telehealth: Payer: Self-pay | Admitting: Interventional Cardiology

## 2018-10-14 NOTE — Telephone Encounter (Signed)
Spoke with pt and pt decided to check B/P and HR checked initially and was 93/50 and HR 50 rechecked and received the same values Per pt waited a few minutes and checked B/P was 116/45 and HR 52 Informed  pt to check values over the next few days and call back end of week with readings.Pt has noted over several weeks has had dizziness on several occasion but did not check vitals.Will forward to Dr Tamala Julian for review and recommendations ./cy

## 2018-10-14 NOTE — Telephone Encounter (Signed)
New Message  Patient states that he blood pressure and heart rate has been low.    STAT if HR is under 50 or over 120 (normal HR is 60-100 beats per minute)  1) What is your heart rate? 50  2) Do you have a log of your heart rate readings (document readings)? 93/50 hr 50, 93/50 hr 50, 116/45 hr 52   3) Do you have any other symptoms? Dizziness, sweating (only when outside),

## 2018-10-16 MED ORDER — METOPROLOL SUCCINATE ER 25 MG PO TB24: 25.0000 mg | ORAL_TABLET | Freq: Every day | ORAL | 1 refills | Status: DC

## 2018-10-16 NOTE — Telephone Encounter (Signed)
Left message to call back  

## 2018-10-16 NOTE — Telephone Encounter (Signed)
Change the metoprolol tartrate from 25 mg BID to metoprolol succinate 25 mg daily.

## 2018-10-16 NOTE — Telephone Encounter (Signed)
Spoke with pt and went over recommendations per Dr. Tamala Julian.  Pt verbalized understanding and was in agreement with plan.

## 2018-10-18 NOTE — Telephone Encounter (Signed)
Patient called stating she accidentally took her metoprolol tartate 25mg  this AM because she forgot to take it out of her blister back. She took her metoprolol succinate 25mg  last night. Wants to know if this is an overdose. Advised that she did take more than what we recommended, but not an overdose. She should take it easy today. Do not do anything that would increase her risk of falling. BP and HR might be a little low. Seek medical attention if she starts to feel very poorly, otherwise the medication should be out of her system within the next 12-24 hours. Advised patient to take metoprolol succinate out of her blister pack. Patient states that she will do so.  Patient also asking about metoprolol increasing her blood sugars. Advised that although BB can increase blood glucose, the only thing that changes was the salt form therefore I do not think her high blood sugars this AM were from the metoprolol. States she ate soup yesterday with rice. States "rice wouldn't increase it would it" Educated patient that carbs ie rice, pasta, breads increase blood sugars.  Patient appreciative of all the help

## 2018-11-06 ENCOUNTER — Telehealth: Payer: Self-pay | Admitting: Interventional Cardiology

## 2018-11-06 NOTE — Telephone Encounter (Signed)
Patient called to state she is still dealing with low BP and irregular HR, and dizziness.  Her BP at 11:52am 116/53 HR 49.  She states she has called in the past with this same issue.

## 2018-11-06 NOTE — Telephone Encounter (Signed)
Pt called with issues 8/10 and Metoprolol Tartrate was changed to Succinate.  Some BP readings she has had since medication change include: 120/62 142/56 122/59 125/52 HR usually in 60s.  States one day she had what she calls a more severe "dizzy spell"/  BP was 95/43, HR 61 at that time.  Lasted several minutes but eventually resolved.  Only other medication change pt has had is, PCP changed Claritin to Montelukast because pt was having issues with dry eye. Pt also having issues with elevated blood sugars and nutritionist told her this can cause dizziness and dry eyes as well.  Advised I will send to Dr. Tamala Julian for review.

## 2018-11-07 MED ORDER — METOPROLOL SUCCINATE ER 25 MG PO TB24: 12.5000 mg | ORAL_TABLET | Freq: Every day | ORAL | 1 refills | Status: DC

## 2018-11-07 NOTE — Telephone Encounter (Signed)
Spoke with pt and made her aware of recommendations.  Advised to continue monitoring BP, HR and sx and to call next week if no improvement. Pt verbalized understanding and was in agreement with plan.

## 2018-11-07 NOTE — Telephone Encounter (Signed)
Decrease the Toprol to 12.5 mg daily.

## 2018-11-21 DIAGNOSIS — Z23 Encounter for immunization: Secondary | ICD-10-CM | POA: Diagnosis not present

## 2018-11-29 ENCOUNTER — Other Ambulatory Visit: Payer: Self-pay | Admitting: Interventional Cardiology
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
## 2018-12-02 ENCOUNTER — Ambulatory Visit (INDEPENDENT_AMBULATORY_CARE_PROVIDER_SITE_OTHER): Payer: PPO | Admitting: Interventional Cardiology

## 2018-12-02 ENCOUNTER — Other Ambulatory Visit: Payer: Self-pay

## 2018-12-02 ENCOUNTER — Encounter: Payer: Self-pay | Admitting: Interventional Cardiology

## 2018-12-02 VITALS — BP 122/62 | HR 66 | Ht 64.5 in | Wt 158.4 lb

## 2018-12-02 DIAGNOSIS — I25119 Atherosclerotic heart disease of native coronary artery with unspecified angina pectoris: Secondary | ICD-10-CM

## 2018-12-02 DIAGNOSIS — Z7189 Other specified counseling: Secondary | ICD-10-CM | POA: Diagnosis not present

## 2018-12-02 DIAGNOSIS — I5032 Chronic diastolic (congestive) heart failure: Secondary | ICD-10-CM

## 2018-12-02 DIAGNOSIS — I10 Essential (primary) hypertension: Secondary | ICD-10-CM | POA: Diagnosis not present

## 2018-12-02 DIAGNOSIS — E7800 Pure hypercholesterolemia, unspecified: Secondary | ICD-10-CM

## 2018-12-02 DIAGNOSIS — Z1231 Encounter for screening mammogram for malignant neoplasm of breast: Secondary | ICD-10-CM | POA: Diagnosis not present

## 2018-12-02 NOTE — Patient Instructions (Addendum)
Medication Instructions:  1) DISCONTINUE Metoprolol  If you need a refill on your cardiac medications before your next appointment, please call your pharmacy.   Lab work: None If you have labs (blood work) drawn today and your tests are completely normal, you will receive your results only by: Marland Kitchen MyChart Message (if you have MyChart) OR . A paper copy in the mail If you have any lab test that is abnormal or we need to change your treatment, we will call you to review the results.  Testing/Procedures: None  Follow-Up: At [facility], you and your health needs are our priority.  As part of our continuing mission to provide you with exceptional heart care, we have created designated Provider Care Teams.  These Care Teams include your primary Cardiologist (physician) and Advanced Practice Providers (APPs -  Physician Assistants and Nurse Practitioners) who all work together to provide you with the care you need, when you need it. You will need a follow up appointment in 9-12 months.  Please call our office 2 months in advance to schedule this appointment.  You may see Sinclair Grooms, MD or one of the following Advanced Practice Providers on your designated Care Team:   Truitt Merle, NP Cecilie Kicks, NP . Kathyrn Drown, NP  Any Other Special Instructions Will Be Listed Below (If Applicable).

## 2018-12-09 ENCOUNTER — Telehealth: Payer: Self-pay | Admitting: Interventional Cardiology

## 2018-12-09 ENCOUNTER — Other Ambulatory Visit: Payer: Self-pay | Admitting: Interventional Cardiology

## 2018-12-09 MED ORDER — VALSARTAN 160 MG PO TABS: 160.0000 mg | ORAL_TABLET | Freq: Every day | ORAL | 11 refills | Status: DC

## 2018-12-09 NOTE — Telephone Encounter (Signed)
New Message:  Patient had an appointment with Dr. Tamala Julian Monday 09-28 and was directed to stop taking her metoprolol and monitor her BP readings. The following are all of the readings , which were taken between 8 am-12:30pm  09-30: 114/58 HR 70  10-01: 109/56 HR 67 10-02: 140/67 HR 57 10-05: 103/47 HR 68  Please let her know if there is anything else Dr. Tamala Julian would like her to track. Please give her a call with what Dr. Tamala Julian would recommend
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
## 2018-12-10 MED ORDER — VALSARTAN 160 MG PO TABS: 160.0000 mg | ORAL_TABLET | Freq: Every day | ORAL | 3 refills | Status: DC

## 2018-12-10 NOTE — Addendum Note (Signed)
Addended by: Derl Barrow on: 12/10/2018 11:43 AM   Modules accepted: Orders

## 2018-12-10 NOTE — Telephone Encounter (Signed)
BP is good. If off therapy, please stay off.

## 2018-12-10 NOTE — Telephone Encounter (Signed)
Spoke with pt and made her aware of recommendations.  Pt appreciative for call.  

## 2018-12-12 ENCOUNTER — Other Ambulatory Visit: Payer: Self-pay | Admitting: Interventional Cardiology

## 2018-12-12 MED ORDER — VALSARTAN 160 MG PO TABS: 160.0000 mg | ORAL_TABLET | Freq: Every day | ORAL | 3 refills | Status: DC

## 2018-12-17 ENCOUNTER — Encounter (INDEPENDENT_AMBULATORY_CARE_PROVIDER_SITE_OTHER): Payer: Self-pay | Admitting: Ophthalmology

## 2018-12-17 ENCOUNTER — Other Ambulatory Visit: Payer: Self-pay

## 2018-12-17 ENCOUNTER — Ambulatory Visit (INDEPENDENT_AMBULATORY_CARE_PROVIDER_SITE_OTHER): Payer: PPO | Admitting: Ophthalmology

## 2018-12-17 DIAGNOSIS — H02402 Unspecified ptosis of left eyelid: Secondary | ICD-10-CM | POA: Diagnosis not present

## 2018-12-17 DIAGNOSIS — I10 Essential (primary) hypertension: Secondary | ICD-10-CM | POA: Diagnosis not present

## 2018-12-17 DIAGNOSIS — H519 Unspecified disorder of binocular movement: Secondary | ICD-10-CM | POA: Diagnosis not present

## 2018-12-17 DIAGNOSIS — H35033 Hypertensive retinopathy, bilateral: Secondary | ICD-10-CM

## 2018-12-17 DIAGNOSIS — E119 Type 2 diabetes mellitus without complications: Secondary | ICD-10-CM

## 2018-12-17 DIAGNOSIS — H3581 Retinal edema: Secondary | ICD-10-CM | POA: Diagnosis not present

## 2018-12-17 DIAGNOSIS — H25813 Combined forms of age-related cataract, bilateral: Secondary | ICD-10-CM | POA: Diagnosis not present

## 2018-12-30 ENCOUNTER — Telehealth: Payer: Self-pay | Admitting: Interventional Cardiology

## 2018-12-30 NOTE — Telephone Encounter (Signed)
Will route to Dr. Smith for review and advisement.  

## 2018-12-30 NOTE — Telephone Encounter (Signed)
°  Pt c/o BP issue:  1. What are your last 5 BP readings?  12/16/18: 118/47 HR 61 (Irregular HR) 12/19/18: 121/58 HR 61 (Irregular HR) 12/22/18: 116/50 HR 55 (Irregular HR) 12/25/18: 104/51 HR 67 (Regular HR) 12/28/18: 102/53 HR 61 (Regular HR)   2. Are you having any other symptoms (ex. Dizziness, headache, blurred vision, passed out)?  Dizziness with rapid movement. Not as bad as it has bee, but it is still bad  3. What is your medication issue? PT was told to monitor her BP and HR and report them to [facility]

## 2018-12-31 NOTE — Telephone Encounter (Signed)
Follow up:     Patient returning call back from yesterday concerning results. Please call patient back.

## 2018-12-31 NOTE — Telephone Encounter (Signed)
Pt states she was up late last night working on a project and suddenly had a wave a dizziness come over her.  She finished what she was doing and checked her BP and it was 82/58.  30 minutes later it was 78/51.  She held off on taking her Valsartan this morning.  Feeling a little woozy this morning.  Had her take her BP while on the phone with me and it was 148/78, HR 62 (irregular) but she had just gotten back from getting the newspaper when I called.  Pt says her BP monitor tells her if her HR is regular or irregular.  Advised I will send updated message to Dr. Tamala Julian for review.

## 2019-01-01 MED ORDER — VALSARTAN 160 MG PO TABS: 80.0000 mg | ORAL_TABLET | Freq: Every day | ORAL | 3 refills | Status: DC

## 2019-01-01 NOTE — Telephone Encounter (Signed)
Pt c/o BP issue: STAT if pt c/o blurred vision, one-sided weakness or slurred speech  1. What are your last 5 BP readings?  12/30/18 3:15 am: 82/58 HR 57     3:45 am: 78/51 HR 55 12/31/18 10:00 am : 111/58 HR 63 Pt reports not taking valsartan that morning 01/01/19 10:09 am: 118/56 HR 70 Pt went to get her newspaper right before calling  2. Are you having any other symptoms (ex. Dizziness, headache, blurred vision, passed out)? Not this morning, but the patient has had periods of dizziness until today  3. What is your BP issue? Pt was told to take her BP readings and pass them along to Dr. Tamala Julian and his Nurse

## 2019-01-01 NOTE — Telephone Encounter (Signed)
I spoke with pt and gave her instructions from Dr Tamala Julian. She will cut her 160 mg tablets in half. Does not need new prescription sent in at this time.  Will update med list.  I told pt we could send in new prescription when she calls back with readings if this will be dose she will be on long term.

## 2019-01-01 NOTE — Telephone Encounter (Signed)
Decrease Valsartan to 80 mg daily and measure BP 2-3 times a week for 2 weeks and report.

## 2019-01-16 ENCOUNTER — Telehealth: Payer: Self-pay | Admitting: Interventional Cardiology

## 2019-01-16 MED ORDER — VALSARTAN 40 MG PO TABS: 40.0000 mg | ORAL_TABLET | Freq: Every day | ORAL | 3 refills | Status: DC

## 2019-01-16 NOTE — Telephone Encounter (Signed)
10/28 pt was told to reduce Valsartan to 80mg  QD and call back with BP readings.  These readings are listed below.

## 2019-01-16 NOTE — Telephone Encounter (Signed)
I would recommend further decreasing valsartan to 40 mg/day.  Continue to monitor blood pressure.

## 2019-01-16 NOTE — Telephone Encounter (Signed)
Spoke with pt and went over recommendations.  Pt verbalized understanding and was in agreement with this plan.  

## 2019-01-16 NOTE — Telephone Encounter (Signed)
New Message     Pt is calling to leave her BP readings   Oct 29th BP 126/64 HR 57 (took no Valsartan)  Oct 30th BP 62/42 HR 58 (took 80mg  of Valsartan) Nov 1st BP 96/52 HR 66 ( took 80mg  of Valsartan)  Nov 4th BP 130/61 HR52 (before she took valsartan that morning)  Nov 11th BP 99/45 HR 59 (took 80mg  of Valsartan)    Please call back

## 2019-02-12 ENCOUNTER — Telehealth: Payer: Self-pay | Admitting: Interventional Cardiology

## 2019-02-12 NOTE — Telephone Encounter (Signed)
Pt is not having any current issues.  She is calling to inform of current status and advisement going forward being that she stopped the Valsartan 4 days ago. Aware, Anderson Malta, nurse will follow up with her

## 2019-02-12 NOTE — Telephone Encounter (Signed)
Spoke with pt and she states even after decreasing Valsartan to 40mg , she continued to have debilitating dizziness.  She stopped Valsartan completely 4 days ago and BPs since then are as follows:  104/49, 61 108/54, 63 103/48, 60 120/64, 61  Pt not on any other medications for her BP.  Dizziness has improved and no longer keeps her from doing ADLs.  Advised pt to stay off of Valsartan and continue to monitor BP.  Advised to call the office if consistently 130/80 or higher.  Will route to Dr. Tamala Julian to see if any further recommendations.  Pt appreciative for call.

## 2019-02-12 NOTE — Telephone Encounter (Signed)
New Message  Pt stated that she has stopped taking valsartan 40 mg due to dizziness. Says she has not taken it in a few days and that her blood pressure has been highest since she stopped taking it this morning with was 120/64; 61 HR  Please call to discuss  Per Maude Leriche, send to ch st triage

## 2019-02-13 NOTE — Telephone Encounter (Signed)
Agree, stay off meds.

## 2019-02-19 DIAGNOSIS — E669 Obesity, unspecified: Secondary | ICD-10-CM | POA: Diagnosis not present

## 2019-02-19 DIAGNOSIS — E1129 Type 2 diabetes mellitus with other diabetic kidney complication: Secondary | ICD-10-CM | POA: Diagnosis not present

## 2019-02-19 DIAGNOSIS — R809 Proteinuria, unspecified: Secondary | ICD-10-CM | POA: Diagnosis not present

## 2019-02-19 DIAGNOSIS — I5032 Chronic diastolic (congestive) heart failure: Secondary | ICD-10-CM | POA: Diagnosis not present

## 2019-02-19 DIAGNOSIS — I1310 Hypertensive heart and chronic kidney disease without heart failure, with stage 1 through stage 4 chronic kidney disease, or unspecified chronic kidney disease: Secondary | ICD-10-CM | POA: Diagnosis not present

## 2019-02-19 DIAGNOSIS — I252 Old myocardial infarction: Secondary | ICD-10-CM | POA: Diagnosis not present

## 2019-02-19 DIAGNOSIS — N182 Chronic kidney disease, stage 2 (mild): Secondary | ICD-10-CM | POA: Diagnosis not present

## 2019-02-19 DIAGNOSIS — I209 Angina pectoris, unspecified: Secondary | ICD-10-CM | POA: Diagnosis not present

## 2019-02-19 DIAGNOSIS — I25810 Atherosclerosis of coronary artery bypass graft(s) without angina pectoris: Secondary | ICD-10-CM | POA: Diagnosis not present

## 2019-02-19 DIAGNOSIS — S069X0S Unspecified intracranial injury without loss of consciousness, sequela: Secondary | ICD-10-CM | POA: Diagnosis not present

## 2019-02-19 DIAGNOSIS — E7800 Pure hypercholesterolemia, unspecified: Secondary | ICD-10-CM | POA: Diagnosis not present

## 2019-02-27 ENCOUNTER — Other Ambulatory Visit: Payer: Self-pay | Admitting: *Deleted

## 2019-02-27 MED ORDER — ATORVASTATIN CALCIUM 40 MG PO TABS: 40.0000 mg | ORAL_TABLET | Freq: Every day | ORAL | 3 refills | Status: DC

## 2019-03-17 ENCOUNTER — Ambulatory Visit: Payer: Medicare Other | Attending: Internal Medicine

## 2019-03-17 DIAGNOSIS — Z23 Encounter for immunization: Secondary | ICD-10-CM

## 2019-03-17 NOTE — Progress Notes (Signed)
   Covid-19 Vaccination Clinic  Name:  Debra Thompson    MRN: [id number] DOB: 06/30/1940  03/17/2019  [name] was observed post Covid-19 immunization for 15 minutes without incidence. She was provided with Vaccine Information Sheet and instruction to access the V-Safe system.   [name] was instructed to call 911 with any severe reactions post vaccine: Marland Kitchen Difficulty breathing  . Swelling of your face and throat  . A fast heartbeat  . A bad rash all over your body  . Dizziness and weakness    Immunizations Administered    Name Date Dose VIS Date Route   Pfizer COVID-19 Vaccine 03/17/2019  9:04 AM 0.3 mL 02/14/2019 Intramuscular   Manufacturer: [employer]   Lot: [id number]   [place]: [id number]

## 2019-04-06 ENCOUNTER — Ambulatory Visit: Payer: PPO | Attending: Internal Medicine

## 2019-04-06 DIAGNOSIS — Z23 Encounter for immunization: Secondary | ICD-10-CM | POA: Insufficient documentation

## 2019-04-06 NOTE — Progress Notes (Signed)
   Covid-19 Vaccination Clinic  Name:  Debra Thompson    MRN: [id number] DOB: 1940-10-18  04/06/2019  Debra Thompson was observed post Covid-19 immunization for 15 minutes without incidence. She was provided with Vaccine Information Sheet and instruction to access the V-Safe system.   Debra Thompson was instructed to call 911 with any severe reactions post vaccine: Marland Kitchen Difficulty breathing  . Swelling of your face and throat  . A fast heartbeat  . A bad rash all over your body  . Dizziness and weakness    Immunizations Administered    Name Date Dose VIS Date Route   Pfizer COVID-19 Vaccine 04/06/2019 10:48 AM 0.3 mL 02/14/2019 Intramuscular   Manufacturer: [place]   Lot: [id number]   [place]: [id number]

## 2019-08-18 DIAGNOSIS — E7800 Pure hypercholesterolemia, unspecified: Secondary | ICD-10-CM | POA: Diagnosis not present

## 2019-08-18 DIAGNOSIS — E119 Type 2 diabetes mellitus without complications: Secondary | ICD-10-CM | POA: Diagnosis not present

## 2019-08-25 DIAGNOSIS — D692 Other nonthrombocytopenic purpura: Secondary | ICD-10-CM | POA: Diagnosis not present

## 2019-08-25 DIAGNOSIS — I209 Angina pectoris, unspecified: Secondary | ICD-10-CM | POA: Diagnosis not present

## 2019-08-25 DIAGNOSIS — I5032 Chronic diastolic (congestive) heart failure: Secondary | ICD-10-CM | POA: Diagnosis not present

## 2019-08-25 DIAGNOSIS — I252 Old myocardial infarction: Secondary | ICD-10-CM | POA: Diagnosis not present

## 2019-08-25 DIAGNOSIS — N182 Chronic kidney disease, stage 2 (mild): Secondary | ICD-10-CM | POA: Diagnosis not present

## 2019-08-25 DIAGNOSIS — Z1339 Encounter for screening examination for other mental health and behavioral disorders: Secondary | ICD-10-CM | POA: Diagnosis not present

## 2019-08-25 DIAGNOSIS — E1129 Type 2 diabetes mellitus with other diabetic kidney complication: Secondary | ICD-10-CM | POA: Diagnosis not present

## 2019-08-25 DIAGNOSIS — I1310 Hypertensive heart and chronic kidney disease without heart failure, with stage 1 through stage 4 chronic kidney disease, or unspecified chronic kidney disease: Secondary | ICD-10-CM | POA: Diagnosis not present

## 2019-08-25 DIAGNOSIS — I25810 Atherosclerosis of coronary artery bypass graft(s) without angina pectoris: Secondary | ICD-10-CM | POA: Diagnosis not present

## 2019-08-25 DIAGNOSIS — R809 Proteinuria, unspecified: Secondary | ICD-10-CM | POA: Diagnosis not present

## 2019-08-25 DIAGNOSIS — S069X0S Unspecified intracranial injury without loss of consciousness, sequela: Secondary | ICD-10-CM | POA: Diagnosis not present

## 2019-08-25 DIAGNOSIS — R82998 Other abnormal findings in urine: Secondary | ICD-10-CM | POA: Diagnosis not present

## 2019-08-25 DIAGNOSIS — Z0000 Encounter for general adult medical examination without abnormal findings: Secondary | ICD-10-CM | POA: Diagnosis not present

## 2019-08-25 DIAGNOSIS — E7800 Pure hypercholesterolemia, unspecified: Secondary | ICD-10-CM | POA: Diagnosis not present
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
## 2019-12-17 ENCOUNTER — Other Ambulatory Visit: Payer: Self-pay

## 2019-12-17 ENCOUNTER — Encounter (INDEPENDENT_AMBULATORY_CARE_PROVIDER_SITE_OTHER): Payer: Self-pay | Admitting: Ophthalmology

## 2019-12-17 ENCOUNTER — Ambulatory Visit (INDEPENDENT_AMBULATORY_CARE_PROVIDER_SITE_OTHER): Payer: PPO | Admitting: Ophthalmology

## 2019-12-17 DIAGNOSIS — E119 Type 2 diabetes mellitus without complications: Secondary | ICD-10-CM

## 2019-12-17 DIAGNOSIS — H35033 Hypertensive retinopathy, bilateral: Secondary | ICD-10-CM

## 2019-12-17 DIAGNOSIS — H02402 Unspecified ptosis of left eyelid: Secondary | ICD-10-CM | POA: Diagnosis not present

## 2019-12-17 DIAGNOSIS — I10 Essential (primary) hypertension: Secondary | ICD-10-CM | POA: Diagnosis not present

## 2019-12-17 DIAGNOSIS — H519 Unspecified disorder of binocular movement: Secondary | ICD-10-CM

## 2019-12-17 DIAGNOSIS — H25813 Combined forms of age-related cataract, bilateral: Secondary | ICD-10-CM | POA: Diagnosis not present

## 2019-12-17 DIAGNOSIS — H3581 Retinal edema: Secondary | ICD-10-CM | POA: Diagnosis not present

## 2020-02-03 DIAGNOSIS — Z1231 Encounter for screening mammogram for malignant neoplasm of breast: Secondary | ICD-10-CM | POA: Diagnosis not present

## 2020-02-24 DIAGNOSIS — I5032 Chronic diastolic (congestive) heart failure: Secondary | ICD-10-CM | POA: Diagnosis not present

## 2020-02-24 DIAGNOSIS — M8580 Other specified disorders of bone density and structure, unspecified site: Secondary | ICD-10-CM | POA: Diagnosis not present

## 2020-02-24 DIAGNOSIS — I25810 Atherosclerosis of coronary artery bypass graft(s) without angina pectoris: Secondary | ICD-10-CM | POA: Diagnosis not present

## 2020-02-24 DIAGNOSIS — I209 Angina pectoris, unspecified: Secondary | ICD-10-CM | POA: Diagnosis not present

## 2020-02-24 DIAGNOSIS — N182 Chronic kidney disease, stage 2 (mild): Secondary | ICD-10-CM | POA: Diagnosis not present

## 2020-02-24 DIAGNOSIS — R809 Proteinuria, unspecified: Secondary | ICD-10-CM | POA: Diagnosis not present

## 2020-02-24 DIAGNOSIS — D692 Other nonthrombocytopenic purpura: Secondary | ICD-10-CM | POA: Diagnosis not present

## 2020-02-24 DIAGNOSIS — I1310 Hypertensive heart and chronic kidney disease without heart failure, with stage 1 through stage 4 chronic kidney disease, or unspecified chronic kidney disease: Secondary | ICD-10-CM | POA: Diagnosis not present

## 2020-02-24 DIAGNOSIS — E663 Overweight: Secondary | ICD-10-CM | POA: Diagnosis not present

## 2020-02-24 DIAGNOSIS — S069X0S Unspecified intracranial injury without loss of consciousness, sequela: Secondary | ICD-10-CM | POA: Diagnosis not present

## 2020-02-24 DIAGNOSIS — E7800 Pure hypercholesterolemia, unspecified: Secondary | ICD-10-CM | POA: Diagnosis not present

## 2020-02-24 DIAGNOSIS — M8589 Other specified disorders of bone density and structure, multiple sites: Secondary | ICD-10-CM | POA: Diagnosis not present

## 2020-02-24 DIAGNOSIS — E1129 Type 2 diabetes mellitus with other diabetic kidney complication: Secondary | ICD-10-CM | POA: Diagnosis not present

## 2020-05-22 ENCOUNTER — Other Ambulatory Visit: Payer: Self-pay | Admitting: Interventional Cardiology

## 2020-05-24 MED ORDER — ATORVASTATIN CALCIUM 40 MG PO TABS: ORAL_TABLET | ORAL | 0 refills | Status: AC

## 2020-05-24 NOTE — Addendum Note (Signed)
Addended by: Gaetano Net on: 05/24/2020 08:32 AM   Modules accepted: Orders

## 2020-06-03 DIAGNOSIS — J309 Allergic rhinitis, unspecified: Secondary | ICD-10-CM | POA: Diagnosis not present

## 2020-06-03 DIAGNOSIS — I1310 Hypertensive heart and chronic kidney disease without heart failure, with stage 1 through stage 4 chronic kidney disease, or unspecified chronic kidney disease: Secondary | ICD-10-CM | POA: Diagnosis not present

## 2020-06-03 DIAGNOSIS — I5032 Chronic diastolic (congestive) heart failure: Secondary | ICD-10-CM | POA: Diagnosis not present

## 2020-06-03 DIAGNOSIS — N182 Chronic kidney disease, stage 2 (mild): Secondary | ICD-10-CM | POA: Diagnosis not present

## 2020-06-03 DIAGNOSIS — I130 Hypertensive heart and chronic kidney disease with heart failure and stage 1 through stage 4 chronic kidney disease, or unspecified chronic kidney disease: Secondary | ICD-10-CM | POA: Diagnosis not present

## 2020-06-03 DIAGNOSIS — E1122 Type 2 diabetes mellitus with diabetic chronic kidney disease: Secondary | ICD-10-CM | POA: Diagnosis not present

## 2020-06-03 DIAGNOSIS — E1129 Type 2 diabetes mellitus with other diabetic kidney complication: Secondary | ICD-10-CM | POA: Diagnosis not present

## 2020-06-03 DIAGNOSIS — E7800 Pure hypercholesterolemia, unspecified: Secondary | ICD-10-CM | POA: Diagnosis not present

## 2020-06-07 ENCOUNTER — Telehealth: Payer: Self-pay | Admitting: Interventional Cardiology

## 2020-06-07 MED ORDER — NITROGLYCERIN 0.4 MG SL SUBL: SUBLINGUAL_TABLET | SUBLINGUAL | 0 refills | Status: AC

## 2020-06-07 NOTE — Telephone Encounter (Signed)
Pt's medication was sent to pt's pharmacy as requested. Confirmation received.  °

## 2020-06-07 NOTE — Telephone Encounter (Signed)
*  STAT* If patient is at the pharmacy, call can be transferred to refill team.   1. Which medications need to be refilled? (please list name of each medication and dose if known) nitroGLYCERIN (NITROSTAT) 0.4 MG SL tablet  2. Which pharmacy/location (including street and city if local pharmacy) is medication to be sent to? [id number] - [place], [place] - [address] AT [address]  3. Do they need a 30 day or 90 day supply? 90 supply

## 2020-07-03 DIAGNOSIS — I5032 Chronic diastolic (congestive) heart failure: Secondary | ICD-10-CM | POA: Diagnosis not present

## 2020-07-03 DIAGNOSIS — N182 Chronic kidney disease, stage 2 (mild): Secondary | ICD-10-CM | POA: Diagnosis not present

## 2020-07-03 DIAGNOSIS — E1122 Type 2 diabetes mellitus with diabetic chronic kidney disease: Secondary | ICD-10-CM | POA: Diagnosis not present

## 2020-07-03 DIAGNOSIS — I130 Hypertensive heart and chronic kidney disease with heart failure and stage 1 through stage 4 chronic kidney disease, or unspecified chronic kidney disease: Secondary | ICD-10-CM | POA: Diagnosis not present

## 2020-08-23 DIAGNOSIS — M859 Disorder of bone density and structure, unspecified: Secondary | ICD-10-CM | POA: Diagnosis not present

## 2020-08-23 DIAGNOSIS — Z0000 Encounter for general adult medical examination without abnormal findings: Secondary | ICD-10-CM | POA: Diagnosis not present

## 2020-08-23 DIAGNOSIS — E7800 Pure hypercholesterolemia, unspecified: Secondary | ICD-10-CM | POA: Diagnosis not present

## 2020-08-23 DIAGNOSIS — E1129 Type 2 diabetes mellitus with other diabetic kidney complication: Secondary | ICD-10-CM | POA: Diagnosis not present

## 2020-09-01 DIAGNOSIS — I1310 Hypertensive heart and chronic kidney disease without heart failure, with stage 1 through stage 4 chronic kidney disease, or unspecified chronic kidney disease: Secondary | ICD-10-CM | POA: Diagnosis not present

## 2020-09-01 DIAGNOSIS — N182 Chronic kidney disease, stage 2 (mild): Secondary | ICD-10-CM | POA: Diagnosis not present

## 2020-09-01 DIAGNOSIS — I5032 Chronic diastolic (congestive) heart failure: Secondary | ICD-10-CM | POA: Diagnosis not present

## 2020-09-01 DIAGNOSIS — I25810 Atherosclerosis of coronary artery bypass graft(s) without angina pectoris: Secondary | ICD-10-CM | POA: Diagnosis not present

## 2020-09-01 DIAGNOSIS — Z1331 Encounter for screening for depression: Secondary | ICD-10-CM | POA: Diagnosis not present

## 2020-09-01 DIAGNOSIS — R82998 Other abnormal findings in urine: Secondary | ICD-10-CM | POA: Diagnosis not present

## 2020-09-01 DIAGNOSIS — Z1339 Encounter for screening examination for other mental health and behavioral disorders: Secondary | ICD-10-CM | POA: Diagnosis not present

## 2020-09-01 DIAGNOSIS — D692 Other nonthrombocytopenic purpura: Secondary | ICD-10-CM | POA: Diagnosis not present

## 2020-09-01 DIAGNOSIS — S069X0S Unspecified intracranial injury without loss of consciousness, sequela: Secondary | ICD-10-CM | POA: Diagnosis not present

## 2020-09-01 DIAGNOSIS — M8580 Other specified disorders of bone density and structure, unspecified site: Secondary | ICD-10-CM | POA: Diagnosis not present

## 2020-09-01 DIAGNOSIS — Z0000 Encounter for general adult medical examination without abnormal findings: Secondary | ICD-10-CM | POA: Diagnosis not present

## 2020-09-01 DIAGNOSIS — E7800 Pure hypercholesterolemia, unspecified: Secondary | ICD-10-CM | POA: Diagnosis not present

## 2020-09-01 DIAGNOSIS — I209 Angina pectoris, unspecified: Secondary | ICD-10-CM | POA: Diagnosis not present

## 2020-09-01 DIAGNOSIS — E663 Overweight: Secondary | ICD-10-CM | POA: Diagnosis not present

## 2020-09-01 DIAGNOSIS — E1129 Type 2 diabetes mellitus with other diabetic kidney complication: Secondary | ICD-10-CM | POA: Diagnosis not present

## 2020-10-03 DIAGNOSIS — N182 Chronic kidney disease, stage 2 (mild): Secondary | ICD-10-CM | POA: Diagnosis not present

## 2020-10-03 DIAGNOSIS — E1122 Type 2 diabetes mellitus with diabetic chronic kidney disease: Secondary | ICD-10-CM | POA: Diagnosis not present

## 2020-10-03 DIAGNOSIS — I130 Hypertensive heart and chronic kidney disease with heart failure and stage 1 through stage 4 chronic kidney disease, or unspecified chronic kidney disease: Secondary | ICD-10-CM | POA: Diagnosis not present

## 2020-10-03 DIAGNOSIS — I5032 Chronic diastolic (congestive) heart failure: Secondary | ICD-10-CM | POA: Diagnosis not present

## 2020-10-12 ENCOUNTER — Other Ambulatory Visit: Payer: Self-pay | Admitting: Interventional Cardiology

## 2020-12-03 DIAGNOSIS — I5032 Chronic diastolic (congestive) heart failure: Secondary | ICD-10-CM | POA: Diagnosis not present

## 2020-12-03 DIAGNOSIS — I130 Hypertensive heart and chronic kidney disease with heart failure and stage 1 through stage 4 chronic kidney disease, or unspecified chronic kidney disease: Secondary | ICD-10-CM | POA: Diagnosis not present

## 2020-12-03 DIAGNOSIS — N182 Chronic kidney disease, stage 2 (mild): Secondary | ICD-10-CM | POA: Diagnosis not present

## 2020-12-03 DIAGNOSIS — E1122 Type 2 diabetes mellitus with diabetic chronic kidney disease: Secondary | ICD-10-CM | POA: Diagnosis not present
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
## 2020-12-15 ENCOUNTER — Ambulatory Visit (INDEPENDENT_AMBULATORY_CARE_PROVIDER_SITE_OTHER): Payer: PPO | Admitting: Ophthalmology

## 2020-12-15 ENCOUNTER — Other Ambulatory Visit: Payer: Self-pay

## 2020-12-15 DIAGNOSIS — H519 Unspecified disorder of binocular movement: Secondary | ICD-10-CM | POA: Diagnosis not present

## 2020-12-15 DIAGNOSIS — H02402 Unspecified ptosis of left eyelid: Secondary | ICD-10-CM

## 2020-12-15 DIAGNOSIS — I10 Essential (primary) hypertension: Secondary | ICD-10-CM

## 2020-12-15 DIAGNOSIS — H25813 Combined forms of age-related cataract, bilateral: Secondary | ICD-10-CM | POA: Diagnosis not present

## 2020-12-15 DIAGNOSIS — H35033 Hypertensive retinopathy, bilateral: Secondary | ICD-10-CM | POA: Diagnosis not present

## 2020-12-15 DIAGNOSIS — E119 Type 2 diabetes mellitus without complications: Secondary | ICD-10-CM | POA: Diagnosis not present

## 2020-12-15 DIAGNOSIS — H3581 Retinal edema: Secondary | ICD-10-CM

## 2020-12-18 ENCOUNTER — Encounter (INDEPENDENT_AMBULATORY_CARE_PROVIDER_SITE_OTHER): Payer: Self-pay | Admitting: Ophthalmology

## 2020-12-31 DIAGNOSIS — S04012A Injury of optic nerve, left eye, initial encounter: Secondary | ICD-10-CM | POA: Diagnosis not present

## 2020-12-31 DIAGNOSIS — H524 Presbyopia: Secondary | ICD-10-CM | POA: Diagnosis not present

## 2020-12-31 DIAGNOSIS — H2513 Age-related nuclear cataract, bilateral: Secondary | ICD-10-CM | POA: Diagnosis not present

## 2020-12-31 DIAGNOSIS — H5010 Unspecified exotropia: Secondary | ICD-10-CM | POA: Diagnosis not present

## 2021-01-13 DIAGNOSIS — H25812 Combined forms of age-related cataract, left eye: Secondary | ICD-10-CM | POA: Diagnosis not present

## 2021-01-13 DIAGNOSIS — H2512 Age-related nuclear cataract, left eye: Secondary | ICD-10-CM | POA: Diagnosis not present

## 2021-02-03 DIAGNOSIS — H2511 Age-related nuclear cataract, right eye: Secondary | ICD-10-CM | POA: Diagnosis not present

## 2021-02-03 DIAGNOSIS — H25811 Combined forms of age-related cataract, right eye: Secondary | ICD-10-CM | POA: Diagnosis not present

## 2021-02-08 DIAGNOSIS — Z1231 Encounter for screening mammogram for malignant neoplasm of breast: Secondary | ICD-10-CM | POA: Diagnosis not present

## 2021-03-04 DIAGNOSIS — E1129 Type 2 diabetes mellitus with other diabetic kidney complication: Secondary | ICD-10-CM | POA: Diagnosis not present

## 2021-03-04 DIAGNOSIS — I1310 Hypertensive heart and chronic kidney disease without heart failure, with stage 1 through stage 4 chronic kidney disease, or unspecified chronic kidney disease: Secondary | ICD-10-CM | POA: Diagnosis not present

## 2021-03-04 DIAGNOSIS — R809 Proteinuria, unspecified: Secondary | ICD-10-CM | POA: Diagnosis not present

## 2021-03-04 DIAGNOSIS — E7800 Pure hypercholesterolemia, unspecified: Secondary | ICD-10-CM | POA: Diagnosis not present

## 2021-03-04 DIAGNOSIS — D692 Other nonthrombocytopenic purpura: Secondary | ICD-10-CM | POA: Diagnosis not present

## 2021-03-04 DIAGNOSIS — N182 Chronic kidney disease, stage 2 (mild): Secondary | ICD-10-CM | POA: Diagnosis not present

## 2021-03-04 DIAGNOSIS — I5032 Chronic diastolic (congestive) heart failure: Secondary | ICD-10-CM | POA: Diagnosis not present

## 2021-03-04 DIAGNOSIS — I25810 Atherosclerosis of coronary artery bypass graft(s) without angina pectoris: Secondary | ICD-10-CM | POA: Diagnosis not present

## 2021-03-04 DIAGNOSIS — E1122 Type 2 diabetes mellitus with diabetic chronic kidney disease: Secondary | ICD-10-CM | POA: Diagnosis not present

## 2021-03-04 DIAGNOSIS — E663 Overweight: Secondary | ICD-10-CM | POA: Diagnosis not present

## 2021-03-04 DIAGNOSIS — S069X0S Unspecified intracranial injury without loss of consciousness, sequela: Secondary | ICD-10-CM | POA: Diagnosis not present

## 2021-03-04 DIAGNOSIS — M8580 Other specified disorders of bone density and structure, unspecified site: Secondary | ICD-10-CM | POA: Diagnosis not present

## 2021-03-04 DIAGNOSIS — I209 Angina pectoris, unspecified: Secondary | ICD-10-CM | POA: Diagnosis not present

## 2021-03-04 DIAGNOSIS — I130 Hypertensive heart and chronic kidney disease with heart failure and stage 1 through stage 4 chronic kidney disease, or unspecified chronic kidney disease: Secondary | ICD-10-CM | POA: Diagnosis not present

## 2021-04-03 DIAGNOSIS — E1122 Type 2 diabetes mellitus with diabetic chronic kidney disease: Secondary | ICD-10-CM | POA: Diagnosis not present

## 2021-04-03 DIAGNOSIS — I5032 Chronic diastolic (congestive) heart failure: Secondary | ICD-10-CM | POA: Diagnosis not present

## 2021-04-03 DIAGNOSIS — N182 Chronic kidney disease, stage 2 (mild): Secondary | ICD-10-CM | POA: Diagnosis not present

## 2021-04-03 DIAGNOSIS — I130 Hypertensive heart and chronic kidney disease with heart failure and stage 1 through stage 4 chronic kidney disease, or unspecified chronic kidney disease: Secondary | ICD-10-CM | POA: Diagnosis not present

## 2021-05-13 DIAGNOSIS — Z809 Family history of malignant neoplasm, unspecified: Secondary | ICD-10-CM | POA: Diagnosis not present

## 2021-05-13 DIAGNOSIS — M1990 Unspecified osteoarthritis, unspecified site: Secondary | ICD-10-CM | POA: Diagnosis not present

## 2021-05-13 DIAGNOSIS — R030 Elevated blood-pressure reading, without diagnosis of hypertension: Secondary | ICD-10-CM | POA: Diagnosis not present

## 2021-05-13 DIAGNOSIS — Z008 Encounter for other general examination: Secondary | ICD-10-CM | POA: Diagnosis not present

## 2021-05-13 DIAGNOSIS — Z8249 Family history of ischemic heart disease and other diseases of the circulatory system: Secondary | ICD-10-CM | POA: Diagnosis not present

## 2021-05-13 DIAGNOSIS — Z823 Family history of stroke: Secondary | ICD-10-CM | POA: Diagnosis not present

## 2021-05-13 DIAGNOSIS — Z7982 Long term (current) use of aspirin: Secondary | ICD-10-CM | POA: Diagnosis not present

## 2021-05-13 DIAGNOSIS — I252 Old myocardial infarction: Secondary | ICD-10-CM | POA: Diagnosis not present

## 2021-05-13 DIAGNOSIS — Z833 Family history of diabetes mellitus: Secondary | ICD-10-CM | POA: Diagnosis not present

## 2021-05-13 DIAGNOSIS — R32 Unspecified urinary incontinence: Secondary | ICD-10-CM | POA: Diagnosis not present

## 2021-05-13 DIAGNOSIS — E785 Hyperlipidemia, unspecified: Secondary | ICD-10-CM | POA: Diagnosis not present

## 2021-07-05 DIAGNOSIS — Z961 Presence of intraocular lens: Secondary | ICD-10-CM | POA: Diagnosis not present

## 2021-09-08 DIAGNOSIS — E7800 Pure hypercholesterolemia, unspecified: Secondary | ICD-10-CM | POA: Diagnosis not present

## 2021-09-08 DIAGNOSIS — R7989 Other specified abnormal findings of blood chemistry: Secondary | ICD-10-CM | POA: Diagnosis not present

## 2021-09-08 DIAGNOSIS — E1129 Type 2 diabetes mellitus with other diabetic kidney complication: Secondary | ICD-10-CM | POA: Diagnosis not present

## 2021-09-08 DIAGNOSIS — M859 Disorder of bone density and structure, unspecified: Secondary | ICD-10-CM | POA: Diagnosis not present

## 2021-09-13 DIAGNOSIS — Z0000 Encounter for general adult medical examination without abnormal findings: Secondary | ICD-10-CM | POA: Diagnosis not present

## 2021-09-13 DIAGNOSIS — Z1331 Encounter for screening for depression: Secondary | ICD-10-CM | POA: Diagnosis not present

## 2021-09-13 DIAGNOSIS — E663 Overweight: Secondary | ICD-10-CM | POA: Diagnosis not present

## 2021-09-13 DIAGNOSIS — I25810 Atherosclerosis of coronary artery bypass graft(s) without angina pectoris: Secondary | ICD-10-CM | POA: Diagnosis not present

## 2021-09-13 DIAGNOSIS — I209 Angina pectoris, unspecified: Secondary | ICD-10-CM | POA: Diagnosis not present

## 2021-09-13 DIAGNOSIS — I130 Hypertensive heart and chronic kidney disease with heart failure and stage 1 through stage 4 chronic kidney disease, or unspecified chronic kidney disease: Secondary | ICD-10-CM | POA: Diagnosis not present

## 2021-09-13 DIAGNOSIS — Z1339 Encounter for screening examination for other mental health and behavioral disorders: Secondary | ICD-10-CM | POA: Diagnosis not present

## 2021-09-13 DIAGNOSIS — E1129 Type 2 diabetes mellitus with other diabetic kidney complication: Secondary | ICD-10-CM | POA: Diagnosis not present

## 2021-09-13 DIAGNOSIS — N182 Chronic kidney disease, stage 2 (mild): Secondary | ICD-10-CM | POA: Diagnosis not present

## 2021-09-13 DIAGNOSIS — I5032 Chronic diastolic (congestive) heart failure: Secondary | ICD-10-CM | POA: Diagnosis not present

## 2021-09-13 DIAGNOSIS — E7800 Pure hypercholesterolemia, unspecified: Secondary | ICD-10-CM | POA: Diagnosis not present

## 2021-09-13 DIAGNOSIS — R82998 Other abnormal findings in urine: Secondary | ICD-10-CM | POA: Diagnosis not present

## 2021-09-13 DIAGNOSIS — D692 Other nonthrombocytopenic purpura: Secondary | ICD-10-CM | POA: Diagnosis not present
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
## 2021-12-16 ENCOUNTER — Ambulatory Visit (INDEPENDENT_AMBULATORY_CARE_PROVIDER_SITE_OTHER): Payer: Medicare HMO | Admitting: Ophthalmology

## 2021-12-16 ENCOUNTER — Encounter (INDEPENDENT_AMBULATORY_CARE_PROVIDER_SITE_OTHER): Payer: Self-pay | Admitting: Ophthalmology

## 2021-12-16 DIAGNOSIS — H35033 Hypertensive retinopathy, bilateral: Secondary | ICD-10-CM

## 2021-12-16 DIAGNOSIS — H519 Unspecified disorder of binocular movement: Secondary | ICD-10-CM

## 2021-12-16 DIAGNOSIS — H02402 Unspecified ptosis of left eyelid: Secondary | ICD-10-CM | POA: Diagnosis not present

## 2021-12-16 DIAGNOSIS — I10 Essential (primary) hypertension: Secondary | ICD-10-CM | POA: Diagnosis not present

## 2021-12-16 DIAGNOSIS — Z961 Presence of intraocular lens: Secondary | ICD-10-CM | POA: Diagnosis not present

## 2021-12-16 DIAGNOSIS — E119 Type 2 diabetes mellitus without complications: Secondary | ICD-10-CM

## 2021-12-16 DIAGNOSIS — H25813 Combined forms of age-related cataract, bilateral: Secondary | ICD-10-CM

## 2022-02-14 DIAGNOSIS — Z1231 Encounter for screening mammogram for malignant neoplasm of breast: Secondary | ICD-10-CM | POA: Diagnosis not present

## 2022-03-22 DIAGNOSIS — H52203 Unspecified astigmatism, bilateral: Secondary | ICD-10-CM | POA: Diagnosis not present

## 2022-03-22 DIAGNOSIS — Z961 Presence of intraocular lens: Secondary | ICD-10-CM | POA: Diagnosis not present

## 2022-03-22 DIAGNOSIS — E119 Type 2 diabetes mellitus without complications: Secondary | ICD-10-CM | POA: Diagnosis not present

## 2022-03-24 DIAGNOSIS — I130 Hypertensive heart and chronic kidney disease with heart failure and stage 1 through stage 4 chronic kidney disease, or unspecified chronic kidney disease: Secondary | ICD-10-CM | POA: Diagnosis not present

## 2022-03-24 DIAGNOSIS — N182 Chronic kidney disease, stage 2 (mild): Secondary | ICD-10-CM | POA: Diagnosis not present

## 2022-03-24 DIAGNOSIS — M8580 Other specified disorders of bone density and structure, unspecified site: Secondary | ICD-10-CM | POA: Diagnosis not present

## 2022-03-24 DIAGNOSIS — I209 Angina pectoris, unspecified: Secondary | ICD-10-CM | POA: Diagnosis not present

## 2022-03-24 DIAGNOSIS — I25810 Atherosclerosis of coronary artery bypass graft(s) without angina pectoris: Secondary | ICD-10-CM | POA: Diagnosis not present

## 2022-03-24 DIAGNOSIS — E1129 Type 2 diabetes mellitus with other diabetic kidney complication: Secondary | ICD-10-CM | POA: Diagnosis not present

## 2022-03-24 DIAGNOSIS — E7800 Pure hypercholesterolemia, unspecified: Secondary | ICD-10-CM | POA: Diagnosis not present

## 2022-03-24 DIAGNOSIS — R809 Proteinuria, unspecified: Secondary | ICD-10-CM | POA: Diagnosis not present

## 2022-03-24 DIAGNOSIS — I5032 Chronic diastolic (congestive) heart failure: Secondary | ICD-10-CM | POA: Diagnosis not present

## 2022-03-24 DIAGNOSIS — E663 Overweight: Secondary | ICD-10-CM | POA: Diagnosis not present

## 2022-03-24 DIAGNOSIS — S069X0S Unspecified intracranial injury without loss of consciousness, sequela: Secondary | ICD-10-CM | POA: Diagnosis not present

## 2022-03-24 DIAGNOSIS — D692 Other nonthrombocytopenic purpura: Secondary | ICD-10-CM | POA: Diagnosis not present

## 2022-09-22 DIAGNOSIS — M8580 Other specified disorders of bone density and structure, unspecified site: Secondary | ICD-10-CM | POA: Diagnosis not present

## 2022-09-22 DIAGNOSIS — E7800 Pure hypercholesterolemia, unspecified: Secondary | ICD-10-CM | POA: Diagnosis not present

## 2022-09-22 DIAGNOSIS — I1310 Hypertensive heart and chronic kidney disease without heart failure, with stage 1 through stage 4 chronic kidney disease, or unspecified chronic kidney disease: Secondary | ICD-10-CM | POA: Diagnosis not present

## 2022-09-22 DIAGNOSIS — E1129 Type 2 diabetes mellitus with other diabetic kidney complication: Secondary | ICD-10-CM | POA: Diagnosis not present

## 2022-09-22 DIAGNOSIS — E785 Hyperlipidemia, unspecified: Secondary | ICD-10-CM | POA: Diagnosis not present

## 2022-09-29 DIAGNOSIS — R82998 Other abnormal findings in urine: Secondary | ICD-10-CM | POA: Diagnosis not present

## 2022-09-29 DIAGNOSIS — I130 Hypertensive heart and chronic kidney disease with heart failure and stage 1 through stage 4 chronic kidney disease, or unspecified chronic kidney disease: Secondary | ICD-10-CM | POA: Diagnosis not present

## 2022-09-29 DIAGNOSIS — Z0000 Encounter for general adult medical examination without abnormal findings: Secondary | ICD-10-CM | POA: Diagnosis not present

## 2022-09-29 DIAGNOSIS — I25810 Atherosclerosis of coronary artery bypass graft(s) without angina pectoris: Secondary | ICD-10-CM | POA: Diagnosis not present

## 2022-09-29 DIAGNOSIS — I209 Angina pectoris, unspecified: Secondary | ICD-10-CM | POA: Diagnosis not present

## 2022-09-29 DIAGNOSIS — N182 Chronic kidney disease, stage 2 (mild): Secondary | ICD-10-CM | POA: Diagnosis not present

## 2022-09-29 DIAGNOSIS — Z1331 Encounter for screening for depression: Secondary | ICD-10-CM | POA: Diagnosis not present

## 2022-09-29 DIAGNOSIS — E7800 Pure hypercholesterolemia, unspecified: Secondary | ICD-10-CM | POA: Diagnosis not present

## 2022-09-29 DIAGNOSIS — E663 Overweight: Secondary | ICD-10-CM | POA: Diagnosis not present

## 2022-09-29 DIAGNOSIS — D692 Other nonthrombocytopenic purpura: Secondary | ICD-10-CM | POA: Diagnosis not present

## 2022-09-29 DIAGNOSIS — E1129 Type 2 diabetes mellitus with other diabetic kidney complication: Secondary | ICD-10-CM | POA: Diagnosis not present

## 2022-09-29 DIAGNOSIS — I5032 Chronic diastolic (congestive) heart failure: Secondary | ICD-10-CM | POA: Diagnosis not present

## 2022-09-29 DIAGNOSIS — Z1339 Encounter for screening examination for other mental health and behavioral disorders: Secondary | ICD-10-CM | POA: Diagnosis not present

## 2022-12-18 ENCOUNTER — Encounter (INDEPENDENT_AMBULATORY_CARE_PROVIDER_SITE_OTHER): Payer: BC Managed Care – PPO | Admitting: Ophthalmology

## 2022-12-18 DIAGNOSIS — H519 Unspecified disorder of binocular movement: Secondary | ICD-10-CM

## 2022-12-18 DIAGNOSIS — Z961 Presence of intraocular lens: Secondary | ICD-10-CM

## 2022-12-18 DIAGNOSIS — H02402 Unspecified ptosis of left eyelid: Secondary | ICD-10-CM

## 2022-12-18 DIAGNOSIS — H35033 Hypertensive retinopathy, bilateral: Secondary | ICD-10-CM

## 2022-12-18 DIAGNOSIS — I10 Essential (primary) hypertension: Secondary | ICD-10-CM

## 2022-12-18 DIAGNOSIS — E119 Type 2 diabetes mellitus without complications: Secondary | ICD-10-CM

## 2023-02-20 DIAGNOSIS — Z1231 Encounter for screening mammogram for malignant neoplasm of breast: Secondary | ICD-10-CM | POA: Diagnosis not present

## 2023-04-03 DIAGNOSIS — I130 Hypertensive heart and chronic kidney disease with heart failure and stage 1 through stage 4 chronic kidney disease, or unspecified chronic kidney disease: Secondary | ICD-10-CM | POA: Diagnosis not present

## 2023-04-03 DIAGNOSIS — E7800 Pure hypercholesterolemia, unspecified: Secondary | ICD-10-CM | POA: Diagnosis not present

## 2023-04-03 DIAGNOSIS — M8580 Other specified disorders of bone density and structure, unspecified site: Secondary | ICD-10-CM | POA: Diagnosis not present

## 2023-04-03 DIAGNOSIS — E663 Overweight: Secondary | ICD-10-CM | POA: Diagnosis not present

## 2023-04-03 DIAGNOSIS — E1129 Type 2 diabetes mellitus with other diabetic kidney complication: Secondary | ICD-10-CM | POA: Diagnosis not present

## 2023-04-03 DIAGNOSIS — D692 Other nonthrombocytopenic purpura: Secondary | ICD-10-CM | POA: Diagnosis not present

## 2023-04-03 DIAGNOSIS — I209 Angina pectoris, unspecified: Secondary | ICD-10-CM | POA: Diagnosis not present

## 2023-04-03 DIAGNOSIS — I25810 Atherosclerosis of coronary artery bypass graft(s) without angina pectoris: Secondary | ICD-10-CM | POA: Diagnosis not present

## 2023-04-03 DIAGNOSIS — I5032 Chronic diastolic (congestive) heart failure: Secondary | ICD-10-CM | POA: Diagnosis not present

## 2023-04-03 DIAGNOSIS — N182 Chronic kidney disease, stage 2 (mild): Secondary | ICD-10-CM | POA: Diagnosis not present

## 2023-04-03 DIAGNOSIS — S069X0S Unspecified intracranial injury without loss of consciousness, sequela: Secondary | ICD-10-CM | POA: Diagnosis not present

## 2023-04-03 DIAGNOSIS — R809 Proteinuria, unspecified: Secondary | ICD-10-CM | POA: Diagnosis not present

## 2023-06-11 DIAGNOSIS — Z961 Presence of intraocular lens: Secondary | ICD-10-CM | POA: Diagnosis not present

## 2023-06-11 DIAGNOSIS — E119 Type 2 diabetes mellitus without complications: Secondary | ICD-10-CM | POA: Diagnosis not present

## 2023-06-11 DIAGNOSIS — H52202 Unspecified astigmatism, left eye: Secondary | ICD-10-CM | POA: Diagnosis not present

## 2023-06-25 DIAGNOSIS — J309 Allergic rhinitis, unspecified: Secondary | ICD-10-CM | POA: Diagnosis not present

## 2023-06-25 DIAGNOSIS — H6691 Otitis media, unspecified, right ear: Secondary | ICD-10-CM | POA: Diagnosis not present

## 2023-06-25 DIAGNOSIS — E1129 Type 2 diabetes mellitus with other diabetic kidney complication: Secondary | ICD-10-CM | POA: Diagnosis not present

## 2023-06-25 DIAGNOSIS — H9191 Unspecified hearing loss, right ear: Secondary | ICD-10-CM | POA: Diagnosis not present

## 2023-07-17 DIAGNOSIS — M069 Rheumatoid arthritis, unspecified: Secondary | ICD-10-CM | POA: Diagnosis not present

## 2023-07-17 DIAGNOSIS — Z7982 Long term (current) use of aspirin: Secondary | ICD-10-CM | POA: Diagnosis not present

## 2023-07-17 DIAGNOSIS — I130 Hypertensive heart and chronic kidney disease with heart failure and stage 1 through stage 4 chronic kidney disease, or unspecified chronic kidney disease: Secondary | ICD-10-CM | POA: Diagnosis not present

## 2023-07-17 DIAGNOSIS — R32 Unspecified urinary incontinence: Secondary | ICD-10-CM | POA: Diagnosis not present

## 2023-07-17 DIAGNOSIS — I25119 Atherosclerotic heart disease of native coronary artery with unspecified angina pectoris: Secondary | ICD-10-CM | POA: Diagnosis not present

## 2023-07-17 DIAGNOSIS — I739 Peripheral vascular disease, unspecified: Secondary | ICD-10-CM | POA: Diagnosis not present

## 2023-07-17 DIAGNOSIS — I509 Heart failure, unspecified: Secondary | ICD-10-CM | POA: Diagnosis not present

## 2023-07-17 DIAGNOSIS — K5900 Constipation, unspecified: Secondary | ICD-10-CM | POA: Diagnosis not present

## 2023-07-17 DIAGNOSIS — Z833 Family history of diabetes mellitus: Secondary | ICD-10-CM | POA: Diagnosis not present

## 2023-07-17 DIAGNOSIS — Z8249 Family history of ischemic heart disease and other diseases of the circulatory system: Secondary | ICD-10-CM | POA: Diagnosis not present

## 2023-07-17 DIAGNOSIS — M1990 Unspecified osteoarthritis, unspecified site: Secondary | ICD-10-CM | POA: Diagnosis not present

## 2023-07-17 DIAGNOSIS — D6949 Other primary thrombocytopenia: Secondary | ICD-10-CM | POA: Diagnosis not present

## 2023-09-26 ENCOUNTER — Ambulatory Visit (INDEPENDENT_AMBULATORY_CARE_PROVIDER_SITE_OTHER): Admitting: Otolaryngology

## 2023-09-26 VITALS — BP 132/69 | HR 70 | Ht 64.5 in | Wt 120.0 lb

## 2023-09-26 DIAGNOSIS — H6521 Chronic serous otitis media, right ear: Secondary | ICD-10-CM | POA: Diagnosis not present

## 2023-09-26 DIAGNOSIS — J343 Hypertrophy of nasal turbinates: Secondary | ICD-10-CM | POA: Diagnosis not present

## 2023-09-26 DIAGNOSIS — H903 Sensorineural hearing loss, bilateral: Secondary | ICD-10-CM

## 2023-09-26 DIAGNOSIS — R0981 Nasal congestion: Secondary | ICD-10-CM

## 2023-09-26 DIAGNOSIS — J310 Chronic rhinitis: Secondary | ICD-10-CM | POA: Diagnosis not present

## 2023-09-26 DIAGNOSIS — J342 Deviated nasal septum: Secondary | ICD-10-CM

## 2023-09-26 DIAGNOSIS — H90A11 Conductive hearing loss, unilateral, right ear with restricted hearing on the contralateral side: Secondary | ICD-10-CM

## 2023-09-26 DIAGNOSIS — H6981 Other specified disorders of Eustachian tube, right ear: Secondary | ICD-10-CM | POA: Diagnosis not present

## 2023-09-26 DIAGNOSIS — H6121 Impacted cerumen, right ear: Secondary | ICD-10-CM

## 2023-09-27 DIAGNOSIS — H9011 Conductive hearing loss, unilateral, right ear, with unrestricted hearing on the contralateral side: Secondary | ICD-10-CM | POA: Insufficient documentation

## 2023-09-27 DIAGNOSIS — H6981 Other specified disorders of Eustachian tube, right ear: Secondary | ICD-10-CM | POA: Insufficient documentation

## 2023-09-27 DIAGNOSIS — J342 Deviated nasal septum: Secondary | ICD-10-CM | POA: Insufficient documentation

## 2023-09-27 DIAGNOSIS — J310 Chronic rhinitis: Secondary | ICD-10-CM | POA: Insufficient documentation

## 2023-09-27 DIAGNOSIS — H6521 Chronic serous otitis media, right ear: Secondary | ICD-10-CM | POA: Insufficient documentation

## 2023-09-27 DIAGNOSIS — J343 Hypertrophy of nasal turbinates: Secondary | ICD-10-CM | POA: Insufficient documentation

## 2023-09-27 DIAGNOSIS — H6121 Impacted cerumen, right ear: Secondary | ICD-10-CM | POA: Insufficient documentation

## 2023-09-27 MED ORDER — FLUTICASONE PROPIONATE 50 MCG/ACT NA SUSP: 2.0000 | Freq: Every day | NASAL | 10 refills | Status: AC
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
## 2023-10-02 DIAGNOSIS — R82998 Other abnormal findings in urine: Secondary | ICD-10-CM | POA: Diagnosis not present

## 2023-10-02 DIAGNOSIS — E119 Type 2 diabetes mellitus without complications: Secondary | ICD-10-CM | POA: Diagnosis not present

## 2023-10-18 ENCOUNTER — Ambulatory Visit (INDEPENDENT_AMBULATORY_CARE_PROVIDER_SITE_OTHER): Admitting: Radiology

## 2023-10-18 ENCOUNTER — Ambulatory Visit
Admission: EM | Admit: 2023-10-18 | Discharge: 2023-10-18 | Disposition: A | Discharge status: Home / Self Care | Attending: Physician Assistant | Admitting: Physician Assistant

## 2023-10-18 ENCOUNTER — Emergency Department (HOSPITAL_BASED_OUTPATIENT_CLINIC_OR_DEPARTMENT_OTHER)

## 2023-10-18 ENCOUNTER — Emergency Department (HOSPITAL_BASED_OUTPATIENT_CLINIC_OR_DEPARTMENT_OTHER)
Admission: EM | Admit: 2023-10-18 | Discharge: 2023-10-18 | Disposition: A | Discharge status: Home / Self Care | Source: Ambulatory Visit

## 2023-10-18 ENCOUNTER — Other Ambulatory Visit: Payer: Self-pay

## 2023-10-18 ENCOUNTER — Encounter (HOSPITAL_BASED_OUTPATIENT_CLINIC_OR_DEPARTMENT_OTHER): Payer: Self-pay | Admitting: Emergency Medicine

## 2023-10-18 DIAGNOSIS — M1612 Unilateral primary osteoarthritis, left hip: Secondary | ICD-10-CM | POA: Diagnosis not present

## 2023-10-18 DIAGNOSIS — M25452 Effusion, left hip: Secondary | ICD-10-CM | POA: Diagnosis not present

## 2023-10-18 DIAGNOSIS — Y92002 Bathroom of unspecified non-institutional (private) residence single-family (private) house as the place of occurrence of the external cause: Secondary | ICD-10-CM | POA: Diagnosis not present

## 2023-10-18 DIAGNOSIS — S0512XA Contusion of eyeball and orbital tissues, left eye, initial encounter: Secondary | ICD-10-CM

## 2023-10-18 DIAGNOSIS — M25552 Pain in left hip: Secondary | ICD-10-CM | POA: Diagnosis not present

## 2023-10-18 DIAGNOSIS — W01198A Fall on same level from slipping, tripping and stumbling with subsequent striking against other object, initial encounter: Secondary | ICD-10-CM | POA: Diagnosis not present

## 2023-10-18 DIAGNOSIS — S0990XA Unspecified injury of head, initial encounter: Secondary | ICD-10-CM | POA: Insufficient documentation

## 2023-10-18 DIAGNOSIS — S0993XA Unspecified injury of face, initial encounter: Secondary | ICD-10-CM | POA: Diagnosis not present

## 2023-10-18 DIAGNOSIS — S7002XA Contusion of left hip, initial encounter: Secondary | ICD-10-CM | POA: Diagnosis not present

## 2023-10-18 DIAGNOSIS — Z7982 Long term (current) use of aspirin: Secondary | ICD-10-CM | POA: Diagnosis not present

## 2023-10-18 DIAGNOSIS — S79912A Unspecified injury of left hip, initial encounter: Secondary | ICD-10-CM | POA: Diagnosis not present

## 2023-10-18 DIAGNOSIS — M160 Bilateral primary osteoarthritis of hip: Secondary | ICD-10-CM | POA: Diagnosis not present
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
## 2023-10-18 NOTE — ED Triage Notes (Addendum)
 Pt reports falling to the floor when going to the bathroom this morning, hit head and LT hip on the floor, denies blood thinners but takes baby aspirin  every day  A&O x 4, denies HA or vision changes, ecchymosis and edema noted on LT cheek  Was seen at [facility] and hip xray was neg, referred here d/t swelling in LT hip

## 2023-10-18 NOTE — ED Notes (Signed)
 Pt declined wheelchair and requested to walk.

## 2023-10-18 NOTE — ED Provider Notes (Signed)
 [facility] Provider Note   CSN: [id number] Arrival date & time: 10/18/23  1317     Patient presents with: Debra Thompson is a 83 y.o. female patient who presents to the emergency department today for further evaluation of left hip pain and swelling after mechanical trip and fall that occurred last night.  Patient states that she was going to the bathroom and it was dark and upon sitting down from the toilet seat she miscalculated the distance and fell onto her left hip and hit her face against the toilet.  She did not lose consciousness.  Patient only takes aspirin .  No other anticoagulation.  Patient went to bed as she was not in that much pain.  Upon waking up this morning and as the day is progressively gotten by she has had worsening pain and swelling.  She states her left hip feels extremely hard.  She was seen and evaluated urgent care who was sent here to rule out compartment syndrome.  Patient had a left hip x-ray at urgent care and this was normal.    Fall       Prior to Admission medications   Medication Sig Start Date End Date Taking? Authorizing Provider  aspirin  EC 81 MG EC tablet Take 1 tablet (81 mg total) by mouth daily. 05/03/14   Maxie Herlene POUR, PA-C  atorvastatin  (LIPITOR) 40 MG tablet TAKE 1 TABLET(40 MG) BY MOUTH DAILY 05/24/20   Claudene Victory ORN, MD  cholecalciferol (VITAMIN D) 1000 UNITS tablet Take 1,000 Units by mouth daily.    [provider]  Coenzyme Q10 (CO Q 10) 100 MG CAPS Take 100 mg by mouth daily.    [provider]  fluticasone  (FLONASE ) 50 MCG/ACT nasal spray Place 2 sprays into both nostrils daily. 09/27/23 10/27/23  Karis Clunes, MD  loratadine (CLARITIN) 10 MG tablet Take 10 mg by mouth daily. Patient not taking: Reported on 09/26/2023    [provider]  montelukast (SINGULAIR) 10 MG tablet Take 10 mg by mouth daily. Patient not taking: Reported on 09/26/2023 11/17/18    [provider]  nitroGLYCERIN  (NITROSTAT ) 0.4 MG SL tablet TAKE 1 TABLET UNDER THE TONGUE EVERY 5 MINUTES FOR 3 DOSES AS NEEDED FOR CHEST PAIN 06/07/20   Claudene Victory ORN, MD  Omega-3 Fatty Acids (FISH OIL) 1000 MG CAPS Take 1,000 mg by mouth daily. Patient not taking: Reported on 09/26/2023    [provider]    Allergies: Ramipril  and Iodine    Review of Systems  Updated Vital Signs BP (!) 145/76 (BP Location: Right Arm)   Pulse 70   Temp 98.2 F (36.8 C) (Oral)   Resp 18   Ht 5' 4.5 (1.638 m)   Wt 52.2 kg   SpO2 98%   BMI 19.45 kg/m   Physical Exam Vitals and nursing note reviewed.  Constitutional:      Appearance: Normal appearance.  HENT:     Head: Normocephalic and atraumatic.  Eyes:     General:        Right eye: No discharge.        Left eye: No discharge.     Conjunctiva/sclera: Conjunctivae normal.  Pulmonary:     Effort: Pulmonary effort is normal.  Musculoskeletal:     Comments: There is a moderate left hip hematoma.  Area is very firm to palpation.  Sensation is intact in both legs. Equal strength.  2+ dorsalis pedis pulses  felt bilaterally  Skin:    General: Skin is warm and dry.     Findings: No rash.  Neurological:     General: No focal deficit present.     Mental Status: She is alert.  Psychiatric:        Mood and Affect: Mood normal.        Behavior: Behavior normal.           (all labs ordered are listed, but only abnormal results are displayed) Labs Reviewed - No data to display  EKG: None  Radiology: CT Hip Left Wo Contrast Result Date: 10/18/2023 CLINICAL DATA:  Status post fall. EXAM: CT OF THE LEFT HIP WITHOUT CONTRAST TECHNIQUE: Multidetector CT imaging of the left hip was performed according to the standard protocol. Multiplanar CT image reconstructions were also generated. RADIATION DOSE REDUCTION: This exam was performed according to the departmental dose-optimization program which includes automated exposure  control, adjustment of the mA and/or kV according to patient size and/or use of iterative reconstruction technique. COMPARISON:  None Available. FINDINGS: Bones/Joint/Cartilage There is no evidence of an acute fracture or dislocation. Mild to moderate severity degenerative changes are seen involving the left hip in the form of joint space narrowing and acetabular sclerosis. Ligaments Suboptimally assessed by CT. Muscles and Tendons Limited in evaluation and otherwise unremarkable. Soft tissues A 2.9 cm x 6.1 cm x 8.0 cm hematoma is seen within the subcutaneous fat along the lateral aspect of the proximal left femur. Marked severity surrounding subcutaneous inflammatory fat stranding is also noted within this region. IMPRESSION: 1. No evidence of an acute fracture or dislocation. 2. Large hematoma within the subcutaneous fat along the lateral aspect of the proximal left femur. 3. Mild to moderate severity degenerative changes involving the left hip. Electronically Signed   By: Suzen Dials M.D.   On: 10/18/2023 17:25   CT Maxillofacial Wo Contrast Result Date: 10/18/2023 CLINICAL DATA:  Facial trauma, blunt; Head trauma, moderate-severe EXAM: CT HEAD WITHOUT CONTRAST CT MAXILLOFACIAL WITHOUT CONTRAST TECHNIQUE: Multidetector CT imaging of the head and maxillofacial structures were performed using the standard protocol without intravenous contrast. Multiplanar CT image reconstructions of the maxillofacial structures were also generated. RADIATION DOSE REDUCTION: This exam was performed according to the departmental dose-optimization program which includes automated exposure control, adjustment of the mA and/or kV according to patient size and/or use of iterative reconstruction technique. COMPARISON:  None Available. FINDINGS: CT HEAD FINDINGS Brain: No evidence of acute infarction, hemorrhage, hydrocephalus, extra-axial collection or mass lesion/mass effect. Patchy white matter hypodensities are nonspecific  but compatible with chronic microvascular ischemic change. Vascular: Calcific atherosclerosis.  No hyperdense vessel Skull: No acute fracture.  Hyperostosis frontalis. Other: No mastoid effusions. CT MAXILLOFACIAL FINDINGS Osseous: No fracture or mandibular dislocation. No destructive process. Orbits: Negative. No traumatic or inflammatory finding. Sinuses: Clear. Soft tissues: Negative. IMPRESSION: No evidence of acute intracranial abnormality or facial fracture. Electronically Signed   By: Gilmore GORMAN Molt M.D.   On: 10/18/2023 15:38   CT Head Wo Contrast Result Date: 10/18/2023 CLINICAL DATA:  Facial trauma, blunt; Head trauma, moderate-severe EXAM: CT HEAD WITHOUT CONTRAST CT MAXILLOFACIAL WITHOUT CONTRAST TECHNIQUE: Multidetector CT imaging of the head and maxillofacial structures were performed using the standard protocol without intravenous contrast. Multiplanar CT image reconstructions of the maxillofacial structures were also generated. RADIATION DOSE REDUCTION: This exam was performed according to the departmental dose-optimization program which includes automated exposure control, adjustment of the mA and/or kV according to patient size and/or use of  iterative reconstruction technique. COMPARISON:  None Available. FINDINGS: CT HEAD FINDINGS Brain: No evidence of acute infarction, hemorrhage, hydrocephalus, extra-axial collection or mass lesion/mass effect. Patchy white matter hypodensities are nonspecific but compatible with chronic microvascular ischemic change. Vascular: Calcific atherosclerosis.  No hyperdense vessel Skull: No acute fracture.  Hyperostosis frontalis. Other: No mastoid effusions. CT MAXILLOFACIAL FINDINGS Osseous: No fracture or mandibular dislocation. No destructive process. Orbits: Negative. No traumatic or inflammatory finding. Sinuses: Clear. Soft tissues: Negative. IMPRESSION: No evidence of acute intracranial abnormality or facial fracture. Electronically Signed   By:  Gilmore GORMAN Molt M.D.   On: 10/18/2023 15:38   DG Hip Unilat With Pelvis 2-3 Views Left Result Date: 10/18/2023 CLINICAL DATA:  Acute left hip pain EXAM: DG HIP (WITH OR WITHOUT PELVIS) 2-3V LEFT COMPARISON:  None Available. FINDINGS: Degenerative changes in the hips bilaterally, symmetric. Joint space narrowing and early spurring. No acute bony abnormality. Specifically, no fracture, subluxation, or dislocation. 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 No acute bony abnormality. Electronically Signed   By: Franky Crease M.D.   On: 10/18/2023 12:05     Procedures   Medications Ordered in the ED - No data to display  Clinical Course as of 10/18/23 1813  Thu Oct 18, 2023  1807 I spoke with Dr. Barton with orthopedics and they were not that impressed with the hematoma. Advised to use clinical judgement.  [CF]    Clinical Course User Index [CF] Theotis Cameron HERO, PA-C    Medical Decision Making Debra Thompson is a 83 y.o. female patient who presents to the emergency department today for further evaluation of left hip pain.  There is a very large hematoma to the left hip.  Patient is not on any anticoagulation secondary to baby aspirin .  Imaging at urgent care was negative.  Will plan to get a CT to further evaluate for size of the hematoma.  Patient is in no acute distress at this time.  Patient is neurovascularly intact distal to the leg.  Given the patient is neurovascularly intact, will plan to send home with strict follow-up and strict return precautions.  This will likely resolve on its own.  She can take ibuprofen for pain.  Imaging of the head and face were both normal as well.  She is overall safe for discharge.   Amount and/or Complexity of Data Reviewed Radiology: ordered.     Final diagnoses:  Hematoma of left hip, initial encounter    ED Discharge Orders     None          Theotis Cameron HERO, [place] 10/18/23 1813    Neysa Caron PARAS, DO 10/19/23 1609

## 2023-10-18 NOTE — Discharge Instructions (Signed)
 You can take 600 mg of ibuprofen every 6 hours as needed for pain.  You can add on Tylenol  at the 3 to 4-hour mark if needed.  This will likely go down on time.  Continue using ice for the next 3 days and then switch to heat.  You can follow-up with orthopedics if symptoms worsen.  You can return to the emergency department sooner for any worsening symptoms.

## 2023-10-18 NOTE — Discharge Instructions (Signed)
 You were seen today for concerns of left hip swelling and changes to your walking.  Given the amount of swelling on your left hip as well as the bruising and the fact you hit your head I recommend that you go to the emergency room for further evaluation. I am concerned that you may have a soft tissue injury or more serious condition known as compartment syndrome that I would like you to get evaluated for in the emergency room. If you are unable to complete the trip to the emergency room please call 911 for assistance

## 2023-10-18 NOTE — ED Triage Notes (Addendum)
 Pt presents with a chief complaint of left hip pain. Pt states she went to the bathroom in the middle of the night (unsure of the time) and fell directly on left hip. Was attempting to sit on toilet in the dark. States she hit the left side of her head as well. Bruising noted to forehead on left side. Currently rates hip pain a 5/10. Denies LOC. Aox4 at this time. Pt states when she walks, it feels like her hip is popping out of place. Helps to hold onto hip for pain management.

## 2023-10-18 NOTE — ED Notes (Signed)
 Patient transported to CT

## 2023-10-18 NOTE — ED Notes (Signed)
 Patient is being discharged from the Urgent Care and sent to the Emergency Department via private vehicle . Per Rocky Mecum PA, patient is in need of higher level of care due to recent fall and hip injury. Patient is aware and verbalizes understanding of plan of care.   Vitals:   10/18/23 1146  BP: (!) 147/79  Pulse: 68  Resp: 18  Temp: 98 F (36.7 C)  SpO2: 96%

## 2023-10-18 NOTE — ED Notes (Signed)
 ED Provider at bedside.

## 2023-11-12 ENCOUNTER — Ambulatory Visit (INDEPENDENT_AMBULATORY_CARE_PROVIDER_SITE_OTHER): Admitting: Audiology

## 2023-11-12 ENCOUNTER — Encounter (INDEPENDENT_AMBULATORY_CARE_PROVIDER_SITE_OTHER): Payer: Self-pay | Admitting: Otolaryngology

## 2023-11-12 ENCOUNTER — Ambulatory Visit (INDEPENDENT_AMBULATORY_CARE_PROVIDER_SITE_OTHER): Admitting: Otolaryngology

## 2023-11-12 VITALS — BP 146/71 | HR 70

## 2023-11-12 DIAGNOSIS — H6121 Impacted cerumen, right ear: Secondary | ICD-10-CM

## 2023-11-12 DIAGNOSIS — H903 Sensorineural hearing loss, bilateral: Secondary | ICD-10-CM

## 2023-11-12 DIAGNOSIS — J310 Chronic rhinitis: Secondary | ICD-10-CM | POA: Diagnosis not present

## 2023-11-12 DIAGNOSIS — H6981 Other specified disorders of Eustachian tube, right ear: Secondary | ICD-10-CM | POA: Diagnosis not present

## 2023-11-12 DIAGNOSIS — J342 Deviated nasal septum: Secondary | ICD-10-CM

## 2023-11-12 DIAGNOSIS — R0981 Nasal congestion: Secondary | ICD-10-CM

## 2023-11-12 DIAGNOSIS — J343 Hypertrophy of nasal turbinates: Secondary | ICD-10-CM

## 2023-11-12 NOTE — Progress Notes (Signed)
 Patient ID: Debra Thompson, female   DOB: October 19, 1940, 83 y.o.   MRN: [id number]  Follow-up: Right middle ear effusion, right ear eustachian tube dysfunction, right ear hearing loss, chronic nasal congestion  HPI: The patient is an 83 year old female who returns today with her daughter.  The patient was last seen in July 2025.  At that time, he was complaining of right ear hearing loss and clogging sensation since January 2025.  Her previous hearing test at [facility] showed right ear conductive hearing loss, in addition to bilateral high-frequency sensorineural hearing loss.  She was noted to have right middle ear effusion, secondary to right ear eustachian tube dysfunction.  In addition, she was also noted to have nasal mucosal congestion, nasal septal deviation, and bilateral inferior turbinate hypertrophy.  She was treated with Flonase  nasal spray and Valsalva exercise.  The patient returns today reporting improvement in her right ear hearing.  However, she is still having difficulty hearing overall, especially in noisy environments.  Her nasal congestion has improved.  Exam: General: Communicates without difficulty, well nourished, no acute distress. Head: Normocephalic, no evidence injury, no tenderness, facial buttresses intact without stepoff. Face/sinus: No tenderness to palpation and percussion. Facial movement is normal and symmetric. Eyes: PERRL, EOMI. No scleral icterus, conjunctivae clear. Neuro: CN II exam reveals vision grossly intact.  No nystagmus at any point of gaze. Ears: Auricles well formed without lesions.  Right ear cerumen impaction.  The left ear canal, tympanic membrane, and middle ear space are normal.  Nose: External evaluation reveals normal support and skin without lesions.  Dorsum is intact.  Anterior rhinoscopy reveals congested mucosa over anterior aspect of inferior turbinates and intact septum.  No purulence noted. Oral:  Oral cavity and oropharynx are intact, symmetric, without  erythema or edema.  Mucosa is moist without lesions. Neck: Full range of motion without pain.  There is no significant lymphadenopathy.  No masses palpable.  Thyroid  bed within normal limits to palpation.  Parotid glands and submandibular glands equal bilaterally without mass.  Trachea is midline. Neuro:  CN 2-12 grossly intact.   Procedure: Right ear cerumen disimpaction Anesthesia: None Description: Under the operating microscope, the cerumen is carefully removed with a combination of cerumen currette, alligator forceps, and suction catheters.  After the cerumen is removed, the TMs are noted to be normal.  No mass, erythema, or lesions. The patient tolerated the procedure well.    Her hearing test shows bilateral symmetric high-frequency sensorineural hearing loss.  The previously noted right ear conductive hearing loss has resolved.  Assessment: 1.  The patient's right middle ear effusion has resolved.  Her tympanic membranes and middle ear spaces are noted to be normal. 2.  Recurrent right ear cerumen impaction. 3.  Chronic rhinitis with nasal mucosal congestion, nasal septal deviation, and bilateral inferior turbinate hypertrophy.  The severity of her nasal congestion has decreased. 4.  Clinically improved right ear eustachian tube dysfunction.  Plan: 1.  Otomicroscopy with right ear cerumen removal. 2.  The physical exam findings and the hearing test results are reviewed with the patient. 3.  The patient is reassured that her right middle ear effusion has resolved. 4.  Continue Flonase  nasal spray and Valsalva exercise as needed. 5.  The patient is a candidate for hearing amplification.  The hearing aid options are discussed. 6.  The patient will return for reevaluation in 6 months.  Plan:
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
## 2024-02-21 DIAGNOSIS — Z1231 Encounter for screening mammogram for malignant neoplasm of breast: Secondary | ICD-10-CM | POA: Diagnosis not present

## 2024-04-28 ENCOUNTER — Ambulatory Visit (INDEPENDENT_AMBULATORY_CARE_PROVIDER_SITE_OTHER): Admitting: Otolaryngology
